# Patient Record
Sex: Female | Born: 1960 | Race: Black or African American | Hispanic: No | Smoking: Current every day smoker
Health system: Southern US, Community
[De-identification: ages and names within clinical notes are randomized; demographics above are authoritative.]

## PROBLEM LIST (undated history)

## (undated) DIAGNOSIS — F419 Anxiety disorder, unspecified: Secondary | ICD-10-CM

## (undated) DIAGNOSIS — I219 Acute myocardial infarction, unspecified: Secondary | ICD-10-CM

## (undated) DIAGNOSIS — I2510 Atherosclerotic heart disease of native coronary artery without angina pectoris: Secondary | ICD-10-CM

## (undated) DIAGNOSIS — IMO0002 Reserved for concepts with insufficient information to code with codable children: Secondary | ICD-10-CM

## (undated) DIAGNOSIS — E119 Type 2 diabetes mellitus without complications: Secondary | ICD-10-CM

## (undated) DIAGNOSIS — M329 Systemic lupus erythematosus, unspecified: Secondary | ICD-10-CM

## (undated) DIAGNOSIS — E079 Disorder of thyroid, unspecified: Secondary | ICD-10-CM

## (undated) DIAGNOSIS — I10 Essential (primary) hypertension: Secondary | ICD-10-CM

## (undated) DIAGNOSIS — F32A Depression, unspecified: Secondary | ICD-10-CM

## (undated) DIAGNOSIS — K219 Gastro-esophageal reflux disease without esophagitis: Secondary | ICD-10-CM

## (undated) DIAGNOSIS — Z862 Personal history of diseases of the blood and blood-forming organs and certain disorders involving the immune mechanism: Secondary | ICD-10-CM

## (undated) DIAGNOSIS — M5430 Sciatica, unspecified side: Secondary | ICD-10-CM

## (undated) DIAGNOSIS — F329 Major depressive disorder, single episode, unspecified: Secondary | ICD-10-CM

## (undated) DIAGNOSIS — G5600 Carpal tunnel syndrome, unspecified upper limb: Secondary | ICD-10-CM

## (undated) DIAGNOSIS — M722 Plantar fascial fibromatosis: Secondary | ICD-10-CM

## (undated) HISTORY — DX: Plantar fascial fibromatosis: M72.2

## (undated) HISTORY — PX: TUBAL LIGATION: SHX77

## (undated) HISTORY — DX: Personal history of diseases of the blood and blood-forming organs and certain disorders involving the immune mechanism: Z86.2

## (undated) HISTORY — DX: Carpal tunnel syndrome, unspecified upper limb: G56.00

## (undated) HISTORY — DX: Sciatica, unspecified side: M54.30

## (undated) HISTORY — DX: Atherosclerotic heart disease of native coronary artery without angina pectoris: I25.10

---

## 2006-12-26 ENCOUNTER — Ambulatory Visit (HOSPITAL_COMMUNITY): Admission: RE | Admit: 2006-12-26 | Discharge: 2006-12-26 | Payer: Self-pay | Admitting: Obstetrics & Gynecology

## 2006-12-26 ENCOUNTER — Inpatient Hospital Stay: Admission: AD | Admit: 2006-12-26 | Discharge: 2006-12-26 | Payer: Self-pay | Admitting: Obstetrics & Gynecology

## 2009-02-17 HISTORY — PX: CORONARY ANGIOPLASTY WITH STENT PLACEMENT: SHX49

## 2011-10-19 ENCOUNTER — Emergency Department (INDEPENDENT_AMBULATORY_CARE_PROVIDER_SITE_OTHER): Payer: Self-pay

## 2011-10-19 ENCOUNTER — Encounter: Payer: Self-pay | Admitting: *Deleted

## 2011-10-19 ENCOUNTER — Emergency Department (HOSPITAL_BASED_OUTPATIENT_CLINIC_OR_DEPARTMENT_OTHER)
Admission: EM | Admit: 2011-10-19 | Discharge: 2011-10-19 | Disposition: A | Discharge status: Home / Self Care | Payer: Self-pay | Attending: Emergency Medicine | Admitting: Emergency Medicine

## 2011-10-19 ENCOUNTER — Other Ambulatory Visit: Payer: Self-pay

## 2011-10-19 DIAGNOSIS — F341 Dysthymic disorder: Secondary | ICD-10-CM | POA: Insufficient documentation

## 2011-10-19 DIAGNOSIS — M79609 Pain in unspecified limb: Secondary | ICD-10-CM

## 2011-10-19 DIAGNOSIS — I2510 Atherosclerotic heart disease of native coronary artery without angina pectoris: Secondary | ICD-10-CM | POA: Insufficient documentation

## 2011-10-19 DIAGNOSIS — M5412 Radiculopathy, cervical region: Secondary | ICD-10-CM | POA: Insufficient documentation

## 2011-10-19 DIAGNOSIS — Z79899 Other long term (current) drug therapy: Secondary | ICD-10-CM | POA: Insufficient documentation

## 2011-10-19 DIAGNOSIS — R079 Chest pain, unspecified: Secondary | ICD-10-CM

## 2011-10-19 DIAGNOSIS — I10 Essential (primary) hypertension: Secondary | ICD-10-CM | POA: Insufficient documentation

## 2011-10-19 DIAGNOSIS — R209 Unspecified disturbances of skin sensation: Secondary | ICD-10-CM | POA: Insufficient documentation

## 2011-10-19 DIAGNOSIS — M542 Cervicalgia: Secondary | ICD-10-CM

## 2011-10-19 DIAGNOSIS — K219 Gastro-esophageal reflux disease without esophagitis: Secondary | ICD-10-CM | POA: Insufficient documentation

## 2011-10-19 DIAGNOSIS — E039 Hypothyroidism, unspecified: Secondary | ICD-10-CM | POA: Insufficient documentation

## 2011-10-19 HISTORY — DX: Essential (primary) hypertension: I10

## 2011-10-19 HISTORY — DX: Atherosclerotic heart disease of native coronary artery without angina pectoris: I25.10

## 2011-10-19 HISTORY — DX: Anxiety disorder, unspecified: F41.9

## 2011-10-19 HISTORY — DX: Disorder of thyroid, unspecified: E07.9

## 2011-10-19 HISTORY — DX: Major depressive disorder, single episode, unspecified: F32.9

## 2011-10-19 HISTORY — DX: Gastro-esophageal reflux disease without esophagitis: K21.9

## 2011-10-19 HISTORY — DX: Depression, unspecified: F32.A

## 2011-10-19 MED ORDER — IBUPROFEN 800 MG PO TABS: 800.0000 mg | ORAL_TABLET | Freq: Three times a day (TID) | ORAL | Status: AC

## 2011-10-19 MED ORDER — DIAZEPAM 5 MG PO TABS
5.0000 mg | ORAL_TABLET | Freq: Once | ORAL | Status: AC
  Administered 2011-10-19: 5 mg via ORAL
  Filled 2011-10-19: qty 1

## 2011-10-19 MED ORDER — METHYLPREDNISOLONE 4 MG PO KIT: PACK | ORAL | Status: AC

## 2011-10-19 MED ORDER — OXYCODONE-ACETAMINOPHEN 5-325 MG PO TABS
1.0000 | ORAL_TABLET | Freq: Once | ORAL | Status: AC
  Administered 2011-10-19: 1 via ORAL
  Filled 2011-10-19: qty 1

## 2011-10-19 MED ORDER — PREDNISONE 50 MG PO TABS
60.0000 mg | ORAL_TABLET | Freq: Once | ORAL | Status: DC
  Filled 2011-10-19: qty 1

## 2011-10-19 MED ORDER — OXYCODONE-ACETAMINOPHEN 5-325 MG PO TABS: 2.0000 | ORAL_TABLET | ORAL | Status: AC | PRN

## 2011-10-19 NOTE — ED Notes (Signed)
Pt seen by PCP today for shoulder pain- states she was sent to [facility] by him for eval of "pinched nerve in neck" but came here instead

## 2011-10-19 NOTE — ED Notes (Signed)
Pt returned from radiology, NAD noted.

## 2011-10-19 NOTE — ED Notes (Signed)
Pt states that she has had an adverse rxn to prednisone in the past with chest pain and heart fluttering. Pt refuses prednisone at this time.

## 2011-10-19 NOTE — ED Provider Notes (Signed)
History     CSN: [id number] Arrival date & time: 10/19/2011 10:00 PM   First MD Initiated Contact with Patient 10/19/11 2204      Chief Complaint  Patient presents with  . Shoulder Pain    (Consider location/radiation/quality/duration/timing/severity/associated sxs/prior treatment) HPI Comments: Patient has right arm, shoulder and neck pain since October 1. She has been seen by her PCP, urgent care and [place] [facility]. She had x-rays, MRI and lab work and was told everything was normal. Her doctor today told her to [facility] ER for a steroid injection but the wait was too long so she came here. She complains of pain in the right side of her neck that radiates to her shoulder and entire arm. This is associated with tingling and numbness. She denies any fever, vomiting, bowel or bladder incontinence. Denies any trauma but thinks she may have slept funny on her arm several weeks ago. Denies any chest pain, shortness of breath. She does not have any grip strength weakness has not been dropping objects.  The history is provided by the patient.    Past Medical History  Diagnosis Date  . Coronary artery disease   . Hypertension   . Thyroid disease   . Depression   . Acid reflux   . Anxiety     Past Surgical History  Procedure Date  . Coronary angioplasty with stent placement   . Tubal ligation     No family history on file.  History  Substance Use Topics  . Smoking status: Former Games developer  . Smokeless tobacco: Not on file  . Alcohol Use: 0.6 oz/week    1 Glasses of wine per week    OB History    Grav Para Term Preterm Abortions TAB SAB Ect Mult Living                  Review of Systems  Constitutional: Negative for fever, activity change and appetite change.  HENT: Negative for congestion and rhinorrhea.   Respiratory: Negative for cough, chest tightness and shortness of breath.   Cardiovascular: Negative for chest pain.  Gastrointestinal: Negative for nausea, vomiting  and abdominal pain.  Genitourinary: Negative for dysuria and hematuria.  Musculoskeletal: Negative for back pain.  Neurological: Positive for numbness. Negative for headaches.    Allergies  Prednisone  Home Medications   Current Outpatient Rx  Name Route Sig Dispense Refill  . ASPIRIN 81 MG PO TABS Oral Take 81 mg by mouth daily.      . ATORVASTATIN CALCIUM 80 MG PO TABS Oral Take 80 mg by mouth daily.      Marland Kitchen ESCITALOPRAM OXALATE 20 MG PO TABS Oral Take 20 mg by mouth daily.      Marland Kitchen EZETIMIBE 10 MG PO TABS Oral Take 10 mg by mouth daily.      Marland Kitchen METOPROLOL TARTRATE 25 MG PO TABS Oral Take 12.5 mg by mouth 2 (two) times daily.      Marland Kitchen NAPROXEN 500 MG PO TABS Oral Take 500 mg by mouth every 12 (twelve) hours.      Marland Kitchen RANITIDINE HCL 150 MG PO TABS Oral Take 150 mg by mouth 2 (two) times daily.      . IBUPROFEN 800 MG PO TABS Oral Take 1 tablet (800 mg total) by mouth 3 (three) times daily. 21 tablet 0  . METHYLPREDNISOLONE 4 MG PO KIT  follow package directions 21 tablet 0  . OXYCODONE-ACETAMINOPHEN 5-325 MG PO TABS Oral Take 2 tablets  by mouth every 4 (four) hours as needed for pain. 15 tablet 0    BP 131/107  Pulse 63  Temp(Src) 98.1 F (36.7 C) (Oral)  Resp 20  Ht 4\' 10"  (1.473 m)  Wt 191 lb (86.637 kg)  BMI 39.92 kg/m2  SpO2 100%  LMP 07/23/2011  Physical Exam  Constitutional: She is oriented to person, place, and time. She appears well-developed and well-nourished. No distress.  HENT:  Head: Normocephalic and atraumatic.  Mouth/Throat: Oropharynx is clear and moist. No oropharyngeal exudate.  Eyes: Conjunctivae are normal. Pupils are equal, round, and reactive to light.  Neck: Normal range of motion.       R parapinal tenderness   Cardiovascular: Normal rate, regular rhythm and normal heart sounds.   Pulmonary/Chest: Effort normal and breath sounds normal. No respiratory distress.  Abdominal: Soft. There is no tenderness. There is no rebound and no guarding.    Musculoskeletal: Normal range of motion. She exhibits tenderness.       R upper trapezius TTP with spasm. FROM of shoulder and able to range above head.   Neurological: She is alert and oriented to person, place, and time. No cranial nerve deficit.       Equal grip strengths, equal push and pull of upper extremiteis  Skin: Skin is warm.    ED Course  Procedures (including critical care time)  Labs Reviewed - No data to display Dg Chest 2 View  10/19/2011  *RADIOLOGY REPORT*  Clinical Data: Right neck pain, radiating to the right side of the chest and right arm.  CHEST - 2 VIEW  Comparison: None.  Findings: The lungs are well-aerated and clear.  There is no evidence of focal opacification, pleural effusion or pneumothorax.  The heart is normal in size; the mediastinal contour is within normal limits.  No acute osseous abnormalities are seen.  The right shoulder is unremarkable in appearance.  IMPRESSION: No acute cardiopulmonary process seen; no displaced rib fractures identified.  The right shoulder is unremarkable in appearance.  Original Report Authenticated By: Tonia Ghent, M.D.   Dg Cervical Spine Complete  10/19/2011  *RADIOLOGY REPORT*  Clinical Data: Right-sided neck pain, radiating to the right arm and right side of the chest.  CERVICAL SPINE - COMPLETE 4+ VIEW  Comparison: None.  Findings: There is no evidence of fracture or subluxation. Vertebral bodies demonstrate normal height and alignment. Intervertebral disc spaces are preserved.  Anterior and posterior disc osteophyte complexes are noted at multiple levels along the cervical spine.  Prevertebral soft tissues are within normal limits.  The provided odontoid view demonstrates no significant abnormality.  The visualized lung apices are clear.  IMPRESSION:  1.  No evidence of fracture or subluxation along the cervical spine. 2.  Mild degenerative change noted along the cervical spine.  Original Report Authenticated By: Tonia Ghent,  M.D.     1. Cervical radiculopathy       MDM  R neck, shoulder, arm pain, seemingly consistent with cervical radiculopathy.  No weakness or other neurological red flags   Date: 10/19/2011  Rate: 50  Rhythm: sinus bradycardia  QRS Axis: normal  Intervals: normal  ST/T Wave abnormalities: normal  Conduction Disutrbances:none  Narrative Interpretation:   Old EKG Reviewed: none available  Records obtained from [facility] show that patient was admitted on October 21 with right shoulder pain that she been having for the past 2 weeks. At that time should difficulty moving her shoulder MRI was obtained that showed no  evidence of rotator cuff tear with degenerative changes of the proximal humerus and a.c. joints MRI of the C-spine was not performed.  Exam today seems more consistent with cervical radiculopathy that a primary shoulder problem. I informed the patient that she needs to follow up with [facility] for a MRI of her cervical spine will treat her symptoms with anti-inflammatories and pain medication. She does not have any neurological deficits today was instructed to return if she develops weakness in the arm, worsening pain, or any other concerning symptoms.     Glynn Octave, MD 10/20/11 [phone]

## 2011-11-25 ENCOUNTER — Emergency Department (HOSPITAL_BASED_OUTPATIENT_CLINIC_OR_DEPARTMENT_OTHER)
Admission: EM | Admit: 2011-11-25 | Discharge: 2011-11-25 | Disposition: A | Discharge status: Other Acute Inpatient Hospital | Payer: Self-pay | Attending: Emergency Medicine | Admitting: Emergency Medicine

## 2011-11-25 ENCOUNTER — Encounter (HOSPITAL_BASED_OUTPATIENT_CLINIC_OR_DEPARTMENT_OTHER): Payer: Self-pay | Admitting: *Deleted

## 2011-11-25 ENCOUNTER — Emergency Department (INDEPENDENT_AMBULATORY_CARE_PROVIDER_SITE_OTHER): Payer: Self-pay

## 2011-11-25 DIAGNOSIS — F341 Dysthymic disorder: Secondary | ICD-10-CM | POA: Insufficient documentation

## 2011-11-25 DIAGNOSIS — I2510 Atherosclerotic heart disease of native coronary artery without angina pectoris: Secondary | ICD-10-CM | POA: Insufficient documentation

## 2011-11-25 DIAGNOSIS — I10 Essential (primary) hypertension: Secondary | ICD-10-CM

## 2011-11-25 DIAGNOSIS — R6884 Jaw pain: Secondary | ICD-10-CM | POA: Insufficient documentation

## 2011-11-25 DIAGNOSIS — E079 Disorder of thyroid, unspecified: Secondary | ICD-10-CM | POA: Insufficient documentation

## 2011-11-25 DIAGNOSIS — R079 Chest pain, unspecified: Secondary | ICD-10-CM | POA: Insufficient documentation

## 2011-11-25 DIAGNOSIS — R110 Nausea: Secondary | ICD-10-CM

## 2011-11-25 LAB — BASIC METABOLIC PANEL
CO2: 27 mEq/L (ref 19–32)
Calcium: 9.1 mg/dL (ref 8.4–10.5)
Chloride: 105 mEq/L (ref 96–112)

## 2011-11-25 LAB — PROTIME-INR
INR: 0.96 (ref 0.00–1.49)
Prothrombin Time: 13 seconds (ref 11.6–15.2)

## 2011-11-25 LAB — CARDIAC PANEL(CRET KIN+CKTOT+MB+TROPI): CK, MB: 1.9 ng/mL (ref 0.3–4.0)

## 2011-11-25 LAB — CBC
Hemoglobin: 11.4 g/dL — ABNORMAL LOW (ref 12.0–15.0)
MCHC: 32.3 g/dL (ref 30.0–36.0)
MCV: 80.8 fL (ref 78.0–100.0)
Platelets: 207 10*3/uL (ref 150–400)

## 2011-11-25 MED ORDER — MORPHINE SULFATE 4 MG/ML IJ SOLN
4.0000 mg | Freq: Once | INTRAMUSCULAR | Status: AC
  Administered 2011-11-25: 4 mg via INTRAVENOUS
  Filled 2011-11-25: qty 1

## 2011-11-25 MED ORDER — ONDANSETRON HCL 4 MG/2ML IJ SOLN
4.0000 mg | Freq: Once | INTRAMUSCULAR | Status: AC
  Administered 2011-11-25: 4 mg via INTRAVENOUS
  Filled 2011-11-25: qty 2

## 2011-11-25 MED ORDER — NITROGLYCERIN 0.4 MG SL SUBL
0.4000 mg | SUBLINGUAL_TABLET | SUBLINGUAL | Status: DC | PRN
  Filled 2011-11-25: qty 25

## 2011-11-25 MED ORDER — ASPIRIN 81 MG PO CHEW
324.0000 mg | CHEWABLE_TABLET | Freq: Once | ORAL | Status: AC
  Administered 2011-11-25: 243 mg via ORAL
  Filled 2011-11-25: qty 1

## 2011-11-25 MED ORDER — ACETAMINOPHEN 325 MG PO TABS
650.0000 mg | ORAL_TABLET | Freq: Once | ORAL | Status: AC
  Administered 2011-11-25: 650 mg via ORAL
  Filled 2011-11-25: qty 2

## 2011-11-25 NOTE — ED Notes (Signed)
Report to Alens, RN at [facility] (room 700)

## 2011-11-25 NOTE — ED Notes (Signed)
Pt states she has been nauseated for a couple days. Weak. Pain in jaw and left shoulder and arm. Took Aspirin last pm, but s/s are worse today.

## 2011-11-25 NOTE — ED Provider Notes (Signed)
History  Scribed for Dione Booze, MD, the patient was seen in room MHT13/MHT13. This chart was scribed by Candelaria Stagers. The patient's care started at 16:00   CSN: [id number] Arrival date & time: 11/25/2011  3:49 PM   First MD Initiated Contact with Patient 11/25/11 1552      Chief Complaint  Patient presents with  . Jaw Pain     The history is provided by the patient.  Robin Hebert is a 50 y.o. female who presents to the Emergency Department complaining of dull achey jaw and shoulder pain which began about 24 hours ago.  She has also been experiencing nausea for the past three days.  She describes the pain as a 6/10 now and a 10/10 at worse.  She has taken aspirin with no relief and nothing else seems to make the pain better or worse.  She is experiencing no fever, chill, but states she has been diaphoretic.  Patient has a h/o MI and states this pain feels similar, but not as bad.  She occasionally drinks and quit smoking about three years ago.  Her cardiologist is Dr. Beverely Pace with [facility].     Past Medical History  Diagnosis Date  . Coronary artery disease   . Hypertension   . Thyroid disease   . Depression   . Acid reflux   . Anxiety     Past Surgical History  Procedure Date  . Coronary angioplasty with stent placement   . Tubal ligation     History reviewed. No pertinent family history.  History  Substance Use Topics  . Smoking status: Former Games developer  . Smokeless tobacco: Not on file  . Alcohol Use: 0.6 oz/week    1 Glasses of wine per week     Review of Systems  Constitutional: Positive for diaphoresis. Negative for fever and chills.  Respiratory: Negative for cough and shortness of breath.   10 Systems reviewed and are negative for acute change except as noted in the HPI.   Allergies  Prednisone  Home Medications   Current Outpatient Rx  Name Route Sig Dispense Refill  . ASPIRIN 81 MG PO TABS Oral Take 81 mg by mouth daily.      .  ATORVASTATIN CALCIUM 80 MG PO TABS Oral Take 80 mg by mouth daily.      Marland Kitchen ESCITALOPRAM OXALATE 20 MG PO TABS Oral Take 20 mg by mouth daily.      Marland Kitchen EZETIMIBE 10 MG PO TABS Oral Take 10 mg by mouth daily.      Marland Kitchen METOPROLOL TARTRATE 25 MG PO TABS Oral Take 25 mg by mouth daily.     Marland Kitchen NAPROXEN 500 MG PO TABS Oral Take 500 mg by mouth 2 (two) times daily as needed. For pain     . RANITIDINE HCL 150 MG PO TABS Oral Take 150 mg by mouth 2 (two) times daily.        BP 135/79  Pulse 52  Temp(Src) 98.3 F (36.8 C) (Oral)  Resp 20  Ht 4\' 10"  (1.473 m)  Wt 191 lb (86.637 kg)  BMI 39.92 kg/m2  SpO2 98%  LMP 05/26/2011  Physical Exam  Constitutional: She is oriented to person, place, and time. She appears well-developed and well-nourished. No distress.  HENT:  Head: Normocephalic and atraumatic.  Mouth/Throat: Oropharynx is clear and moist. No oropharyngeal exudate.  Eyes: EOM are normal. Right eye exhibits no discharge. Left eye exhibits no discharge.  Neck: Normal range of motion.  Neck supple.  Cardiovascular: Normal rate, regular rhythm and normal heart sounds.   Pulmonary/Chest: Effort normal and breath sounds normal. She exhibits tenderness (left peristernal chest tenderness).  Abdominal: Soft. She exhibits no mass. There is no tenderness.  Lymphadenopathy:    She has no cervical adenopathy.  Neurological: She is alert and oriented to person, place, and time.  Skin: Skin is warm and dry.  Psychiatric: She has a normal mood and affect. Her behavior is normal.  Note: above should read left parasternal tenderness.  ED Course  Procedures   DIAGNOSTIC STUDIES: Oxygen Saturation is 98% on room air, normal by my interpretation.    COORDINATION OF CARE:  16:04 Ordered: DG CHEST PORT 1 VIEW  Cardiac monitoring ; Pulse oximetry, continuous ; ED EKG (<24mins upon arrival to the ED) ; Saline lock IV ; Draw & Hold Blood RAINBOW ; CBC ; Basic metabolic panel ; BNP (if dyspnea) ; Weigh patient  ; Oxygen therapy ; nitroGLYCERIN (NITROSTAT) SL tablet 0.4 mg ; Cardiac panel(cret kin+cktot+mb+tropi) ; Protime-INR (if Coumadin Pt) ; Chest Portable 1 View ; aspirin chewable tablet 324 mg ; APTT ; acetaminophen (TYLENOL) tablet 650 mg ; ondansetron (ZOFRAN) injection 4 mg    Labs Reviewed  CBC - Abnormal; Notable for the following:    Hemoglobin 11.4 (*)    HCT 35.3 (*)    RDW 15.8 (*)    All other components within normal limits  BASIC METABOLIC PANEL - Abnormal; Notable for the following:    Glucose, Bld 109 (*)    All other components within normal limits  APTT - Abnormal; Notable for the following:    aPTT 22 (*)    All other components within normal limits  PRO B NATRIURETIC PEPTIDE  CARDIAC PANEL(CRET KIN+CKTOT+MB+TROPI)  PROTIME-INR   Chest Portable 1 View  11/25/2011  *RADIOLOGY REPORT*  Clinical Data: Chest pain and nausea, hypertension  PORTABLE CHEST - 1 VIEW  Comparison: October 19, 2011  Findings: The cardiac silhouette, mediastinum, pulmonary vasculature are within normal limits.  Both lungs are clear. There is no acute bony abnormality.  IMPRESSION: There is no evidence of acute cardiac or pulmonary process.  Original Report Authenticated By: Brandon Melnick, M.D.     No diagnosis found.  ECG shows normal sinus rhythm with a rate of 59, no ectopy. Normal axis. Normal P wave. Normal QRS. Normal intervals. Normal ST and T waves. Impression: normal ECG. When compared with ECG of 10/19/2011, no significant change is noted.  She refused nitroglycerin. For evaluation at 6:05 PM and: She states she is feeling better and the pain is down to 3/10. Workup is negative for acute coronary injury, but in light of how similar symptoms to her heart attacks, or arrange transfer to [facility]. She states her last coronary stent was about one year ago.  Case is discussed with Dr. Anson Fret, hospitalist at [facility], who accepts the  patient in transfer.  MDM  Jaw and shoulder pain which need to be considered an angina equivalent. I anticipate having her admitted once initial workup and stabilization of been completed.   I personally performed the services described in this documentation, which was scribed in my presence. The recorded information has been reviewed and considered.       Dione Booze, MD 11/25/11 [phone]

## 2013-01-09 IMAGING — CR DG CERVICAL SPINE COMPLETE 4+V
6 series · 6 of 6 positions shown · non-contrast
Comparison: None.

CLINICAL DATA: Right-sided neck pain, radiating to the right arm
and right side of the chest.

CERVICAL SPINE - COMPLETE 4+ VIEW

[w c-spine a.p.]
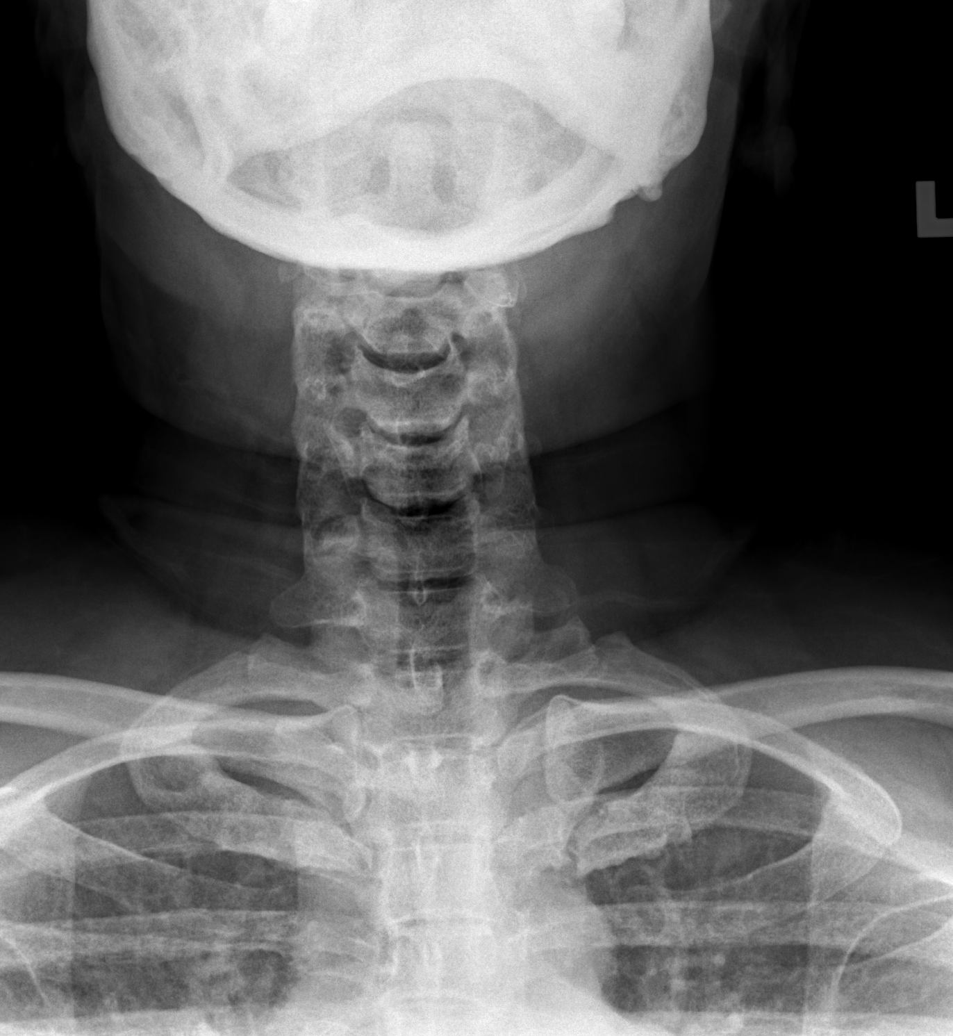

[w c-spine oblique]
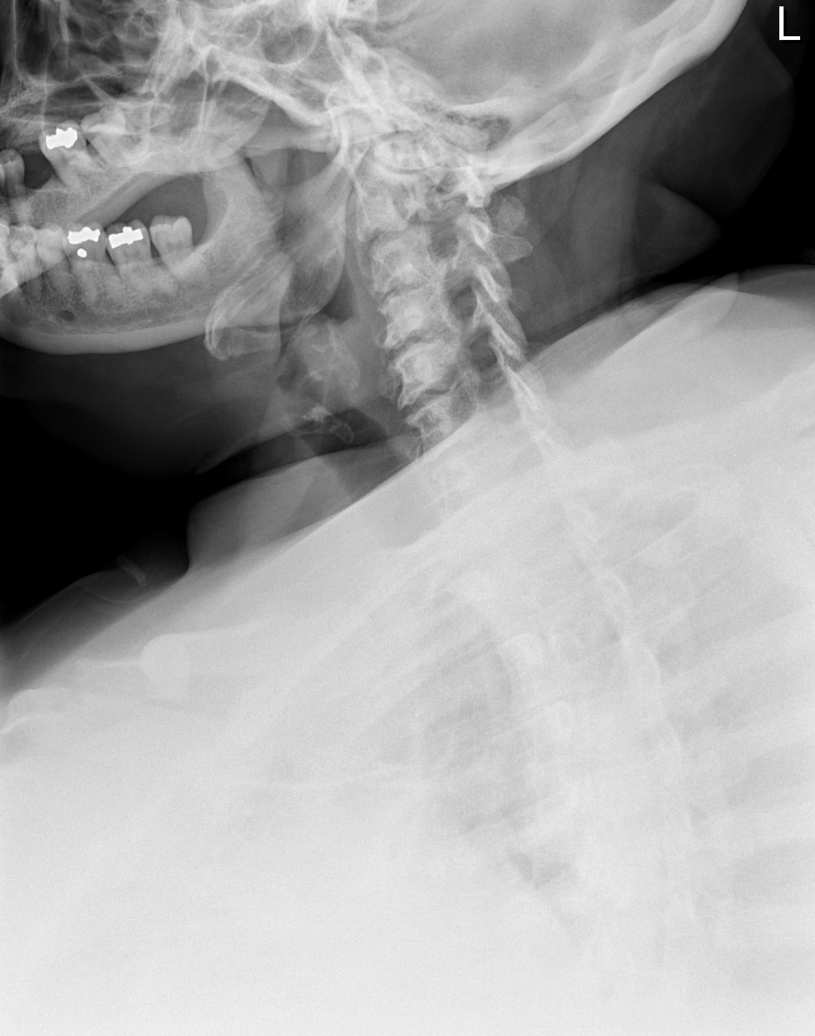

[w c-spine oblique *]
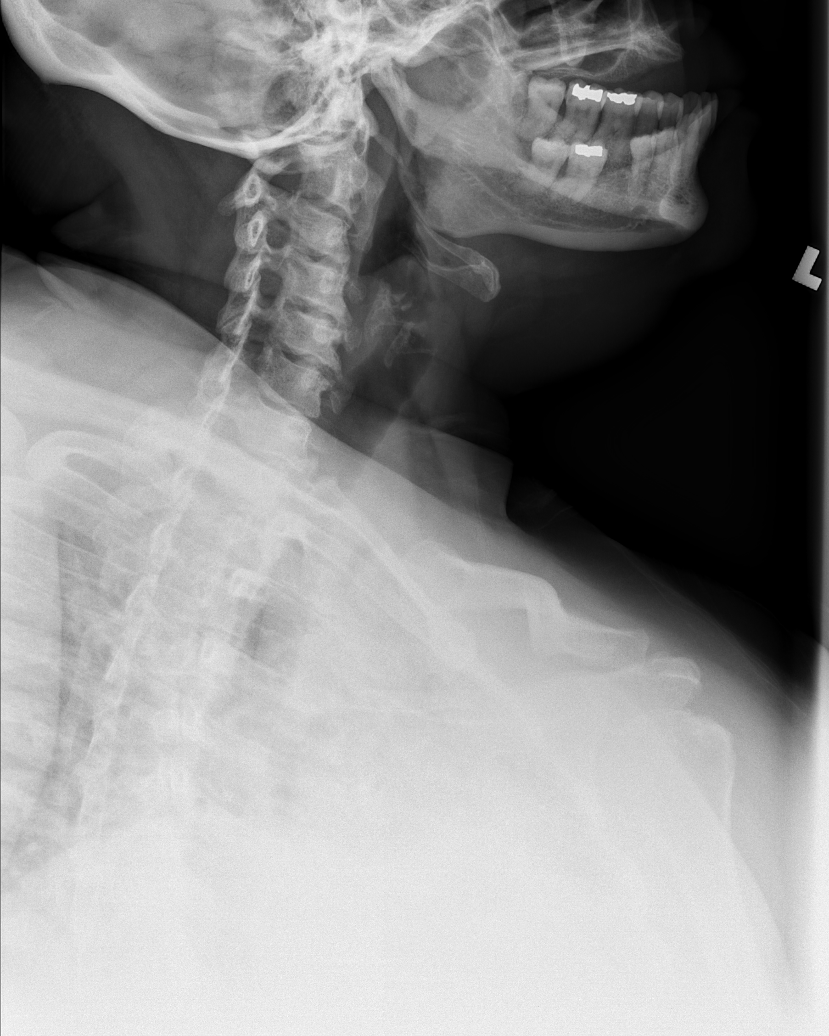

[w c-spine lat]
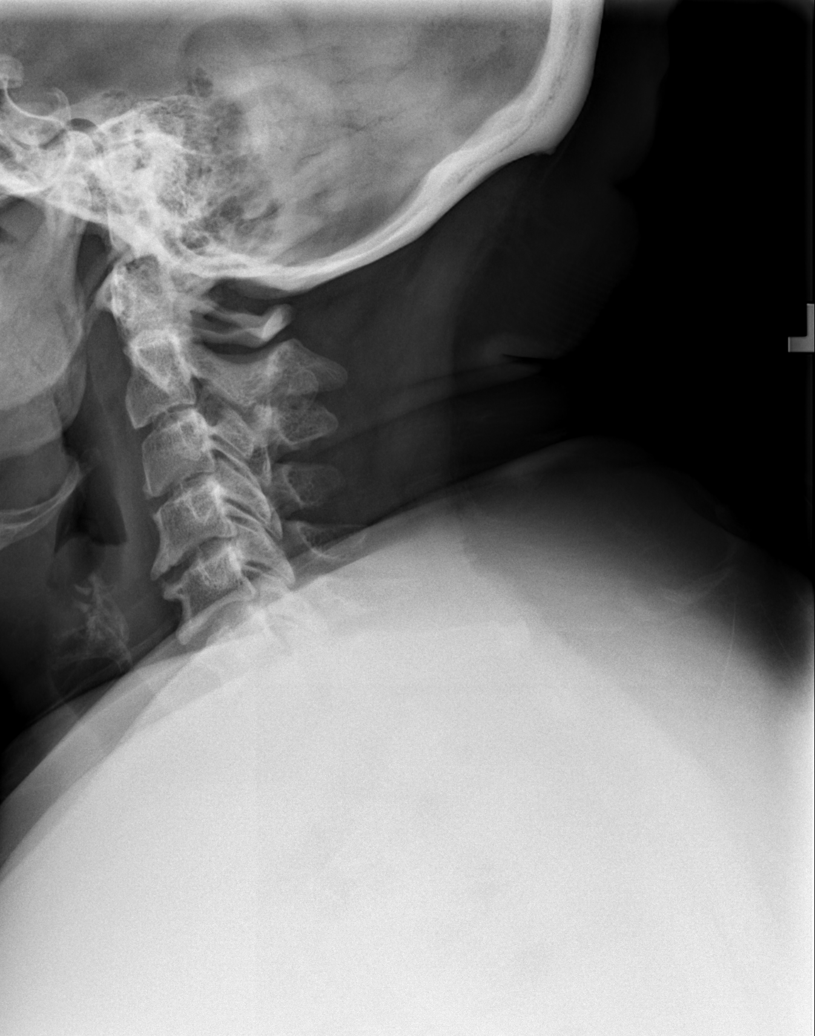

[w swimmers view *]
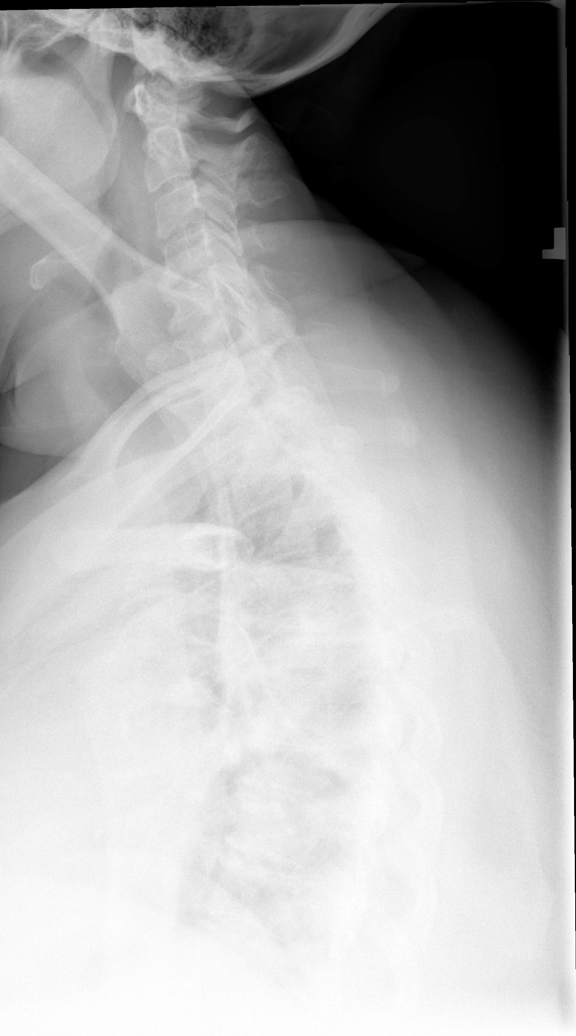

[w c-spine odontoid]
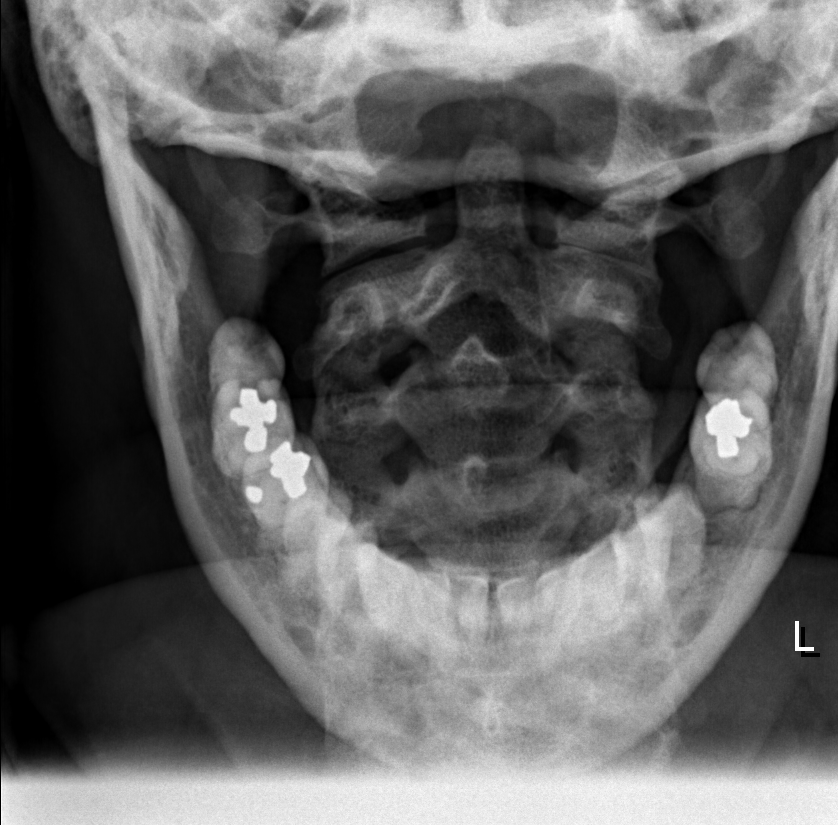

[6 of 6 positions shown; findings below may reference images not displayed]

FINDINGS: There is no evidence of fracture or subluxation.
Vertebral bodies demonstrate normal height and alignment.
Intervertebral disc spaces are preserved.  Anterior and posterior
disc osteophyte complexes are noted at multiple levels along the
cervical spine.  Prevertebral soft tissues are within normal
limits.  The provided odontoid view demonstrates no significant
abnormality.

The visualized lung apices are clear.
IMPRESSION: 1.  No evidence of fracture or subluxation along the cervical
spine.
2.  Mild degenerative change noted along the cervical spine.

## 2013-01-09 IMAGING — CR DG CHEST 2V
2 series · 2 of 2 positions shown · non-contrast
Comparison: None.

CLINICAL DATA: Right neck pain, radiating to the right side of the
chest and right arm.

CHEST - 2 VIEW

[w chest pa]
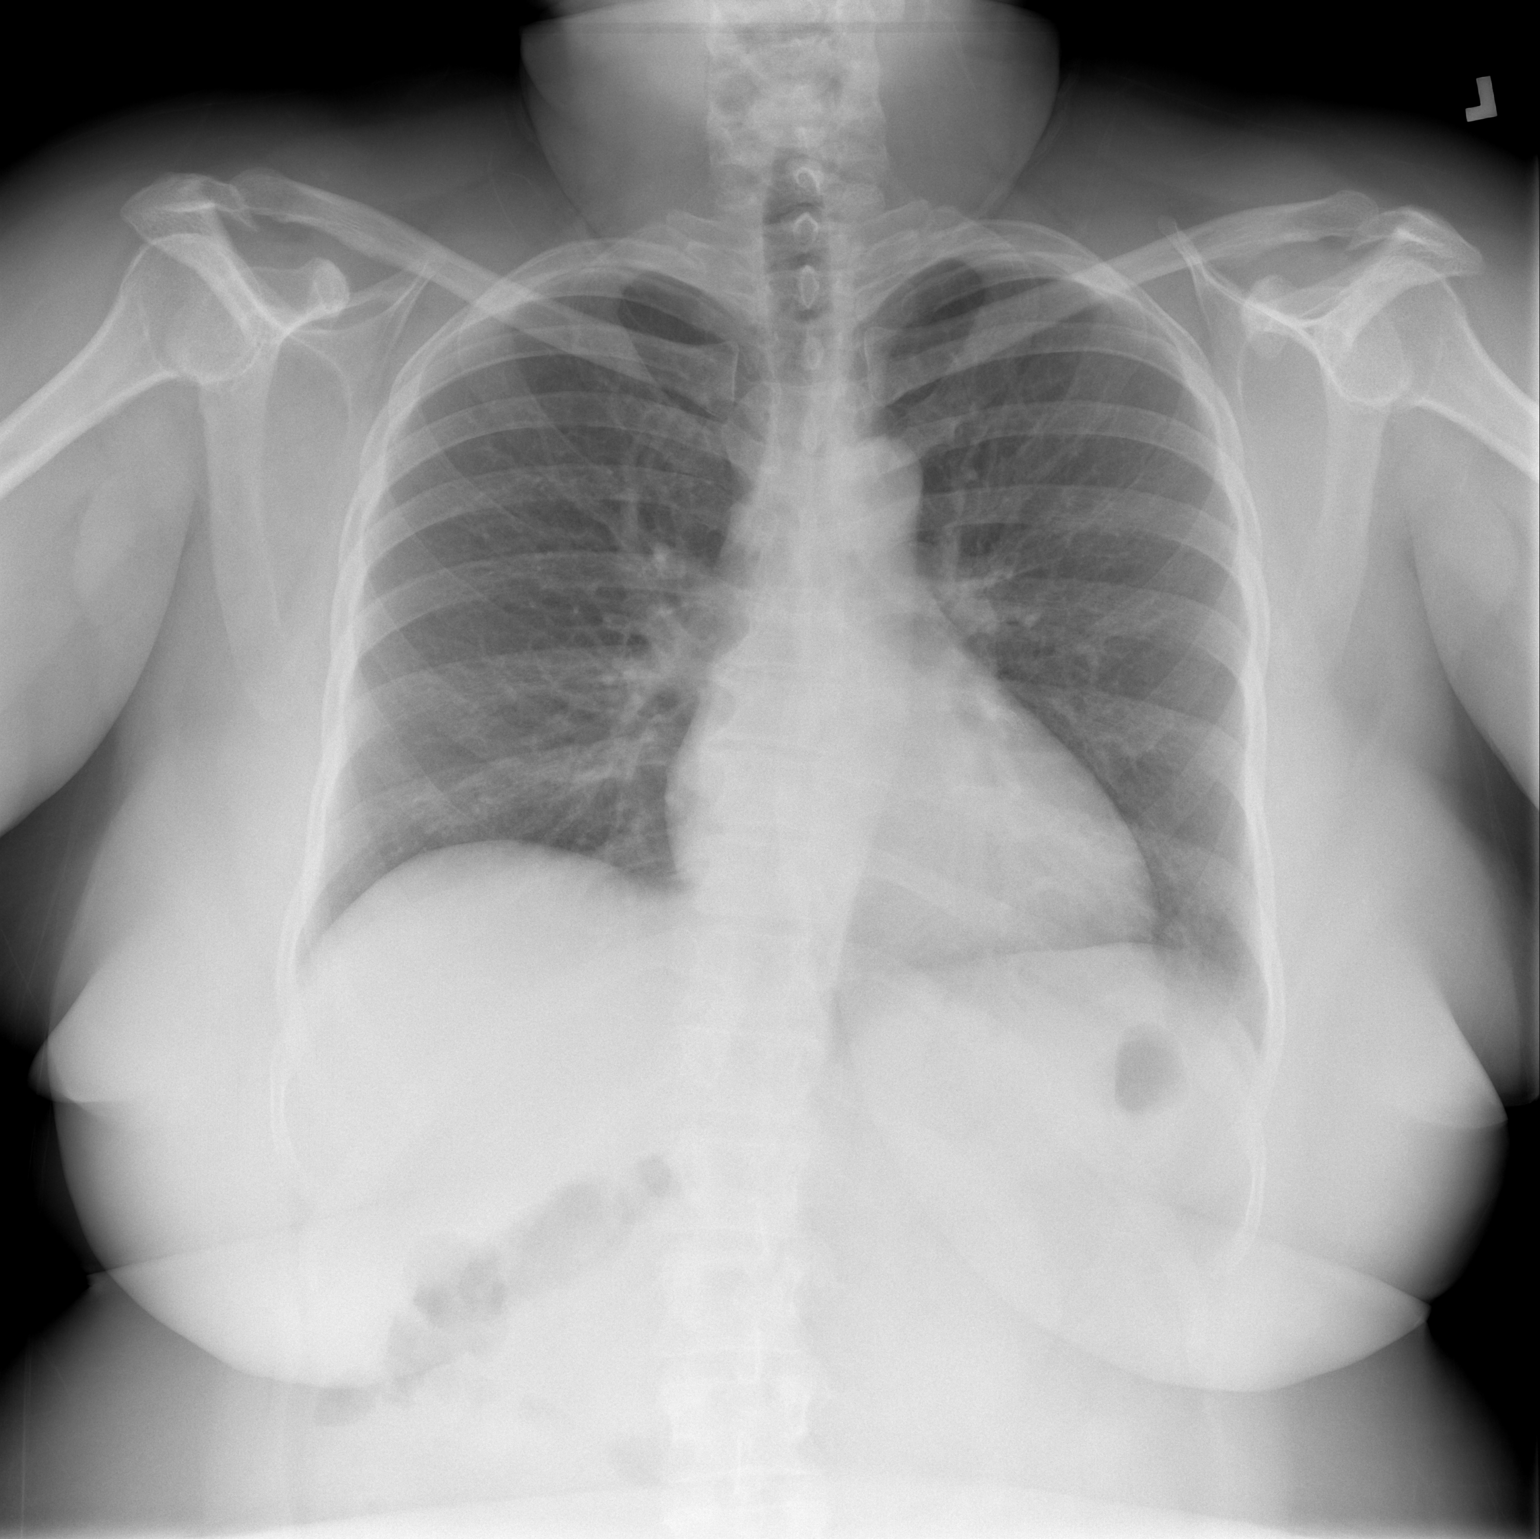

[w chest lat]
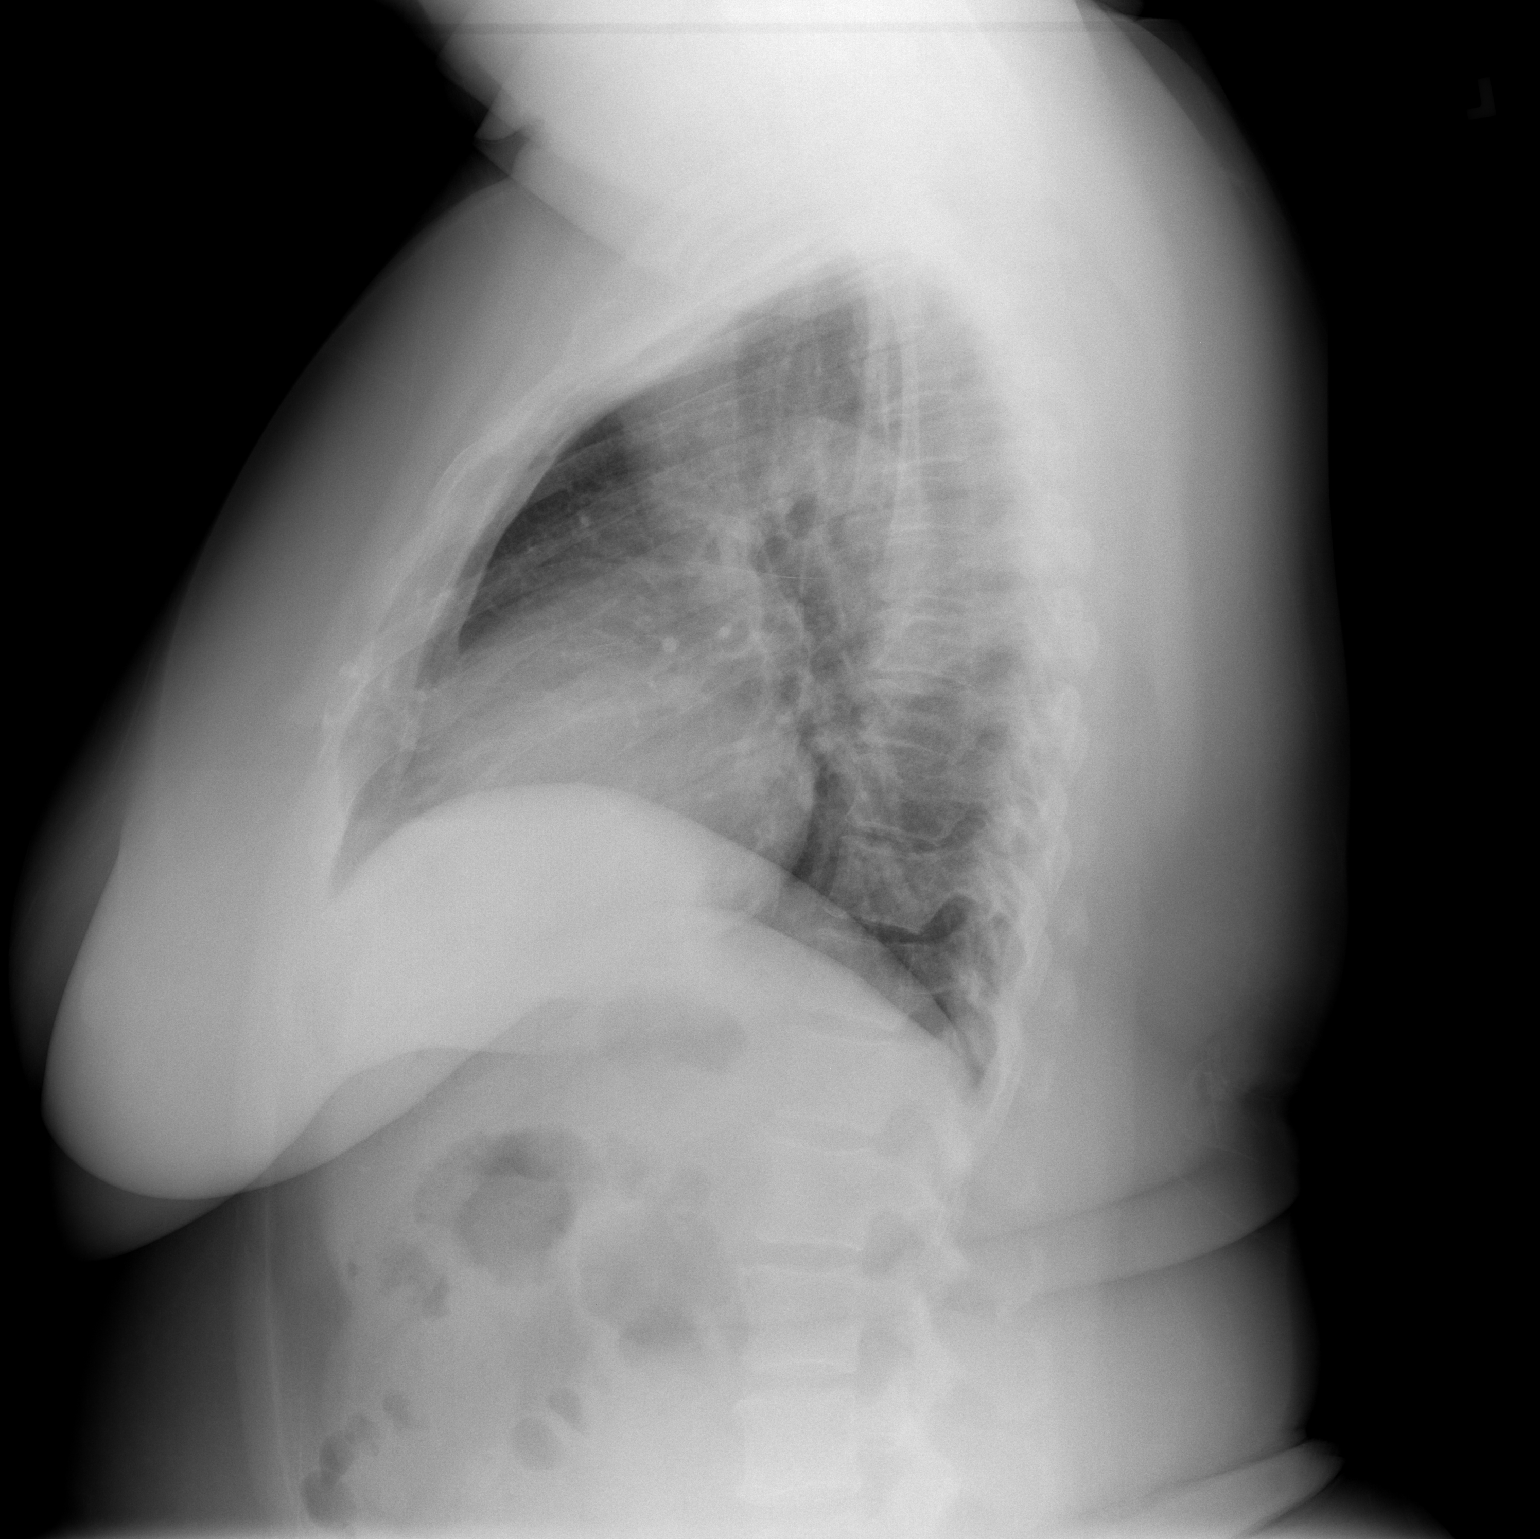

[2 of 2 positions shown; findings below may reference images not displayed]

FINDINGS: The lungs are well-aerated and clear.  There is no
evidence of focal opacification, pleural effusion or pneumothorax.

The heart is normal in size; the mediastinal contour is within
normal limits.  No acute osseous abnormalities are seen.  The right
shoulder is unremarkable in appearance.
IMPRESSION: No acute cardiopulmonary process seen; no displaced rib fractures
identified.  The right shoulder is unremarkable in appearance.

## 2013-04-24 ENCOUNTER — Encounter: Payer: Self-pay | Admitting: Cardiovascular Disease

## 2013-04-24 ENCOUNTER — Encounter: Payer: Self-pay | Admitting: *Deleted

## 2013-04-24 DIAGNOSIS — I2510 Atherosclerotic heart disease of native coronary artery without angina pectoris: Secondary | ICD-10-CM | POA: Insufficient documentation

## 2013-04-24 DIAGNOSIS — K219 Gastro-esophageal reflux disease without esophagitis: Secondary | ICD-10-CM | POA: Insufficient documentation

## 2013-04-24 DIAGNOSIS — I10 Essential (primary) hypertension: Secondary | ICD-10-CM | POA: Insufficient documentation

## 2013-04-24 DIAGNOSIS — F329 Major depressive disorder, single episode, unspecified: Secondary | ICD-10-CM | POA: Insufficient documentation

## 2013-04-24 DIAGNOSIS — F419 Anxiety disorder, unspecified: Secondary | ICD-10-CM | POA: Insufficient documentation

## 2013-04-24 DIAGNOSIS — E079 Disorder of thyroid, unspecified: Secondary | ICD-10-CM | POA: Insufficient documentation

## 2013-04-25 ENCOUNTER — Ambulatory Visit (INDEPENDENT_AMBULATORY_CARE_PROVIDER_SITE_OTHER): Payer: Self-pay | Admitting: Cardiovascular Disease

## 2013-04-25 ENCOUNTER — Encounter: Payer: Self-pay | Admitting: *Deleted

## 2013-04-25 VITALS — BP 130/82 | Ht <= 58 in | Wt 188.0 lb

## 2013-04-25 DIAGNOSIS — I10 Essential (primary) hypertension: Secondary | ICD-10-CM

## 2013-04-25 DIAGNOSIS — R079 Chest pain, unspecified: Secondary | ICD-10-CM

## 2013-04-25 DIAGNOSIS — I2510 Atherosclerotic heart disease of native coronary artery without angina pectoris: Secondary | ICD-10-CM

## 2013-04-25 DIAGNOSIS — E079 Disorder of thyroid, unspecified: Secondary | ICD-10-CM

## 2013-04-25 NOTE — Assessment & Plan Note (Signed)
Seems to have some anxiety Will get more records from [facility]  Pain very atypical and recent myovue 2013 normal and ER w/u negative Continue ASA and beta blocker No indication for cath

## 2013-04-25 NOTE — Assessment & Plan Note (Signed)
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

## 2013-04-25 NOTE — Progress Notes (Signed)
Patient ID: Robin Hebert, female   DOB: July 13, 1961, 52 y.o.   MRN: [id number] 52 yo obese black female All previous care in [facility].  Apparently owes money and Dr Sharalyn Ink practice stopped seeing her. She has CAD.  Stent to RCA in 2010.  Stent to different vessel in 2012.  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  Normal myovue 2013.  Seen in ER [facility] 03/10/13 and r/o.  Compliant with meds. Pain is sharp intermitant Worse when she picks things up. Not like when she had stents. No pleurisy.  Pain not necessarily worse with exercise but worse when she picks things up.    ROS: Denies fever, malais, weight loss, blurry vision, decreased visual acuity, cough, sputum, SOB, hemoptysis, pleuritic pain, palpitaitons, heartburn, abdominal pain, melena, lower extremity edema, claudication, or rash.  All other systems reviewed and negative   General: Affect appropriate Obese large chested black femlae HEENT: normal Neck supple with no adenopathy JVP normal no bruits no thyromegaly Lungs clear with no wheezing and good diaphragmatic motion Heart:  S1/S2 no murmur,rub, gallop or click PMI normal Abdomen: benighn, BS positve, no tenderness, no AAA no bruit.  No HSM or HJR Distal pulses intact with no bruits No edema Neuro non-focal Skin warm and dry No muscular weakness  Medications Current Outpatient Prescriptions  Medication Sig Dispense Refill  . ALPRAZolam (XANAX) 0.5 MG tablet Take 0.5 mg by mouth 2 (two) times daily.      Marland Kitchen aspirin 81 MG tablet Take 81 mg by mouth daily.        Marland Kitchen atorvastatin (LIPITOR) 80 MG tablet Take 80 mg by mouth daily.        Marland Kitchen escitalopram (LEXAPRO) 20 MG tablet Take 20 mg by mouth daily.        Marland Kitchen ezetimibe (ZETIA) 10 MG tablet Take 10 mg by mouth daily.        . metoprolol tartrate (LOPRESSOR) 25 MG tablet Take 25 mg by mouth daily.       . naproxen (NAPROSYN) 375 MG tablet Take 375 mg by mouth daily.      . ranitidine (ZANTAC) 150 MG tablet Take 150 mg by mouth 2 (two) times daily.         . traMADol (ULTRAM) 50 MG tablet Take 50 mg by mouth 2 (two) times daily.       No current facility-administered medications for this visit.    Allergies Prednisone  Family History: Family History  Problem Relation Age of Onset  . Alcoholism    . Cancer    . Diabetes    . Heart disease    . Hypertension    . Renal Disease    . Stroke    . Heart failure Mother   . Heart failure Father   . Congestive Heart Failure Sister   . Heart Problems Brother     Social History: History   Social History  . Marital Status: Widowed    Spouse Name: N/A    Number of Children: N/A  . Years of Education: N/A   Occupational History  . Not on file.   Social History Main Topics  . Smoking status: Former Smoker    Quit date: 01/26/2009  . Smokeless tobacco: Not on file  . Alcohol Use: 0.6 oz/week    1 Glasses of wine per week  . Drug Use: No  . Sexually Active: Not on file   Other Topics Concern  . Not on file   Social History Narrative  .  No narrative on file    Electrocardiogram:  11/29/11  SR rate 59 normal ECG  Assessment and Plan

## 2013-04-25 NOTE — Assessment & Plan Note (Signed)
Well controlled.  Continue current medications and low sodium Dash type diet.    

## 2013-04-25 NOTE — Addendum Note (Signed)
Addended by: Micki Riley C on: 04/25/2013 12:06 PM   Modules accepted: Orders

## 2013-04-25 NOTE — Patient Instructions (Signed)
Your physician wants you to follow-up in:  6 MONTHS WITH DR NISHAN  You will receive a reminder letter in the mail two months in advance. If you don't receive a letter, please call our office to schedule the follow-up appointment. Your physician recommends that you continue on your current medications as directed. Please refer to the Current Medication list given to you today. 

## 2013-04-25 NOTE — Assessment & Plan Note (Addendum)
Not on replacement F/U primary for TSH

## 2013-05-01 ENCOUNTER — Encounter: Payer: Self-pay | Admitting: Cardiovascular Disease

## 2013-05-18 ENCOUNTER — Encounter (HOSPITAL_BASED_OUTPATIENT_CLINIC_OR_DEPARTMENT_OTHER): Payer: Self-pay | Admitting: *Deleted

## 2013-05-18 ENCOUNTER — Emergency Department (HOSPITAL_BASED_OUTPATIENT_CLINIC_OR_DEPARTMENT_OTHER)
Admission: EM | Admit: 2013-05-18 | Discharge: 2013-05-18 | Disposition: A | Discharge status: Home / Self Care | Payer: Self-pay | Attending: Emergency Medicine | Admitting: Emergency Medicine

## 2013-05-18 DIAGNOSIS — Z7982 Long term (current) use of aspirin: Secondary | ICD-10-CM | POA: Insufficient documentation

## 2013-05-18 DIAGNOSIS — Z8719 Personal history of other diseases of the digestive system: Secondary | ICD-10-CM | POA: Insufficient documentation

## 2013-05-18 DIAGNOSIS — Z791 Long term (current) use of non-steroidal anti-inflammatories (NSAID): Secondary | ICD-10-CM | POA: Insufficient documentation

## 2013-05-18 DIAGNOSIS — F3289 Other specified depressive episodes: Secondary | ICD-10-CM | POA: Insufficient documentation

## 2013-05-18 DIAGNOSIS — F329 Major depressive disorder, single episode, unspecified: Secondary | ICD-10-CM | POA: Insufficient documentation

## 2013-05-18 DIAGNOSIS — Z862 Personal history of diseases of the blood and blood-forming organs and certain disorders involving the immune mechanism: Secondary | ICD-10-CM | POA: Insufficient documentation

## 2013-05-18 DIAGNOSIS — Z87891 Personal history of nicotine dependence: Secondary | ICD-10-CM | POA: Insufficient documentation

## 2013-05-18 DIAGNOSIS — G5600 Carpal tunnel syndrome, unspecified upper limb: Secondary | ICD-10-CM | POA: Insufficient documentation

## 2013-05-18 DIAGNOSIS — Z79899 Other long term (current) drug therapy: Secondary | ICD-10-CM | POA: Insufficient documentation

## 2013-05-18 DIAGNOSIS — K029 Dental caries, unspecified: Secondary | ICD-10-CM | POA: Insufficient documentation

## 2013-05-18 DIAGNOSIS — I10 Essential (primary) hypertension: Secondary | ICD-10-CM | POA: Insufficient documentation

## 2013-05-18 DIAGNOSIS — Z9861 Coronary angioplasty status: Secondary | ICD-10-CM | POA: Insufficient documentation

## 2013-05-18 DIAGNOSIS — F411 Generalized anxiety disorder: Secondary | ICD-10-CM | POA: Insufficient documentation

## 2013-05-18 DIAGNOSIS — Z8639 Personal history of other endocrine, nutritional and metabolic disease: Secondary | ICD-10-CM | POA: Insufficient documentation

## 2013-05-18 DIAGNOSIS — I2510 Atherosclerotic heart disease of native coronary artery without angina pectoris: Secondary | ICD-10-CM | POA: Insufficient documentation

## 2013-05-18 MED ORDER — OXYCODONE-ACETAMINOPHEN 5-325 MG PO TABS: 2.0000 | ORAL_TABLET | ORAL | Status: DC | PRN

## 2013-05-18 MED ORDER — OXYCODONE-ACETAMINOPHEN 5-325 MG PO TABS
2.0000 | ORAL_TABLET | Freq: Once | ORAL | Status: AC
  Administered 2013-05-18: 2 via ORAL
  Filled 2013-05-18 (×2): qty 2

## 2013-05-18 MED ORDER — PENICILLIN V POTASSIUM 500 MG PO TABS: 500.0000 mg | ORAL_TABLET | Freq: Three times a day (TID) | ORAL | Status: DC

## 2013-05-18 NOTE — ED Notes (Signed)
Pt states she thinks her filling came out. C/O pain to right lower molar x 1 week.

## 2013-05-18 NOTE — ED Provider Notes (Signed)
History     CSN: [id number]  Arrival date & time 05/18/13  1408   First MD Initiated Contact with Patient 05/18/13 1419      Chief Complaint  Patient presents with  . Dental Pain    (Consider location/radiation/quality/duration/timing/severity/associated sxs/prior treatment) HPI Comments: . She states it's been hurting for the last 2 days. She states about a week ago she felt part of the tooth cough. She denies any facial swelling. She denies any fevers, nausea or vomiting. She currently does not have a dentist. She describes her pain as constant and throbbing.  Patient is a 52 y.o. female presenting with tooth pain.  Dental Pain Associated symptoms: no drooling, no fever and no headaches     Past Medical History  Diagnosis Date  . Coronary artery disease   . Hypertension   . Thyroid disease   . Depression   . Acid reflux   . Anxiety   . Plantar fasciitis   . Sciatica   . Carpal tunnel syndrome   . History of anemia   . CAD (coronary artery disease)     Past Surgical History  Procedure Laterality Date  . Coronary angioplasty with stent placement  02/17/2009  . Tubal ligation      Family History  Problem Relation Age of Onset  . Alcoholism    . Cancer    . Diabetes    . Heart disease    . Hypertension    . Renal Disease    . Stroke    . Heart failure Mother   . Heart failure Father   . Congestive Heart Failure Sister   . Heart Problems Brother     History  Substance Use Topics  . Smoking status: Former Smoker    Quit date: 01/26/2009  . Smokeless tobacco: Not on file  . Alcohol Use: 0.6 oz/week    1 Glasses of wine per week    OB History   Grav Para Term Preterm Abortions TAB SAB Ect Mult Living                  Review of Systems  Constitutional: Negative for fever.  HENT: Positive for dental problem. Negative for drooling.   Gastrointestinal: Negative for nausea and vomiting.  Skin: Negative for rash.  Neurological: Negative for  light-headedness and headaches.    Allergies  Prednisone  Home Medications   Current Outpatient Rx  Name  Route  Sig  Dispense  Refill  . ALPRAZolam (XANAX) 0.5 MG tablet   Oral   Take 0.5 mg by mouth 2 (two) times daily.         Marland Kitchen aspirin 81 MG tablet   Oral   Take 81 mg by mouth daily.           Marland Kitchen atorvastatin (LIPITOR) 80 MG tablet   Oral   Take 80 mg by mouth daily.           Marland Kitchen escitalopram (LEXAPRO) 20 MG tablet   Oral   Take 20 mg by mouth daily.           Marland Kitchen ezetimibe (ZETIA) 10 MG tablet   Oral   Take 10 mg by mouth daily.           . metoprolol tartrate (LOPRESSOR) 25 MG tablet   Oral   Take 25 mg by mouth daily.          . naproxen (NAPROSYN) 375 MG tablet   Oral   Take  375 mg by mouth daily.         Marland Kitchen oxyCODONE-acetaminophen (PERCOCET) 5-325 MG per tablet   Oral   Take 2 tablets by mouth every 4 (four) hours as needed for pain.   15 tablet   0   . penicillin v potassium (VEETID) 500 MG tablet   Oral   Take 1 tablet (500 mg total) by mouth 3 (three) times daily.   30 tablet   0   . ranitidine (ZANTAC) 150 MG tablet   Oral   Take 150 mg by mouth 2 (two) times daily.           . traMADol (ULTRAM) 50 MG tablet   Oral   Take 50 mg by mouth 2 (two) times daily.           BP 176/91  Pulse 60  Temp(Src) 98.4 F (36.9 C) (Oral)  Resp 20  Ht 4\' 10"  (1.473 m)  Wt 186 lb (84.369 kg)  BMI 38.88 kg/m2  SpO2 95%  LMP 02/18/2013  Physical Exam  Constitutional: She is oriented to person, place, and time. She appears well-developed and well-nourished.  HENT:  Patient has tenderness along the right lower second molar. There some mild erythema around the area. There's no swelling, fluctuance or induration. There's no trismus present. There is a filling in place however part of the tooth has broken off.  Cardiovascular: Normal rate.   Pulmonary/Chest: Effort normal.  Neurological: She is alert and oriented to person, place, and time.   Skin: Skin is warm and dry.    ED Course  Procedures (including critical care time)  Labs Reviewed - No data to display No results found.   1. Dental caries       MDM  Patient was given penicillin and Percocet. She was advised to followup with the dental clinic.  She was advised she is to call within the next 48 hours. She also was advised that her blood pressure needs to be rechecked by her primary care physician or cardiologist that she's currently seeing.        Rolan Bucco, MD 05/18/13 [phone]

## 2013-05-21 ENCOUNTER — Telehealth (HOSPITAL_COMMUNITY): Payer: Self-pay | Admitting: Emergency Medicine

## 2013-05-21 NOTE — ED Notes (Signed)
Call from on call dentist requesting demographics and referral be faxed to their office. 

## 2013-09-19 ENCOUNTER — Encounter (HOSPITAL_BASED_OUTPATIENT_CLINIC_OR_DEPARTMENT_OTHER): Payer: Self-pay | Admitting: Student

## 2013-09-19 ENCOUNTER — Emergency Department (HOSPITAL_BASED_OUTPATIENT_CLINIC_OR_DEPARTMENT_OTHER)
Admission: EM | Admit: 2013-09-19 | Discharge: 2013-09-19 | Disposition: A | Discharge status: Home / Self Care | Payer: Medicaid Other | Attending: Emergency Medicine | Admitting: Emergency Medicine

## 2013-09-19 DIAGNOSIS — Z862 Personal history of diseases of the blood and blood-forming organs and certain disorders involving the immune mechanism: Secondary | ICD-10-CM | POA: Insufficient documentation

## 2013-09-19 DIAGNOSIS — K219 Gastro-esophageal reflux disease without esophagitis: Secondary | ICD-10-CM | POA: Insufficient documentation

## 2013-09-19 DIAGNOSIS — B86 Scabies: Secondary | ICD-10-CM | POA: Insufficient documentation

## 2013-09-19 DIAGNOSIS — Z7982 Long term (current) use of aspirin: Secondary | ICD-10-CM | POA: Insufficient documentation

## 2013-09-19 DIAGNOSIS — Z8639 Personal history of other endocrine, nutritional and metabolic disease: Secondary | ICD-10-CM | POA: Insufficient documentation

## 2013-09-19 DIAGNOSIS — F411 Generalized anxiety disorder: Secondary | ICD-10-CM | POA: Insufficient documentation

## 2013-09-19 DIAGNOSIS — Z791 Long term (current) use of non-steroidal anti-inflammatories (NSAID): Secondary | ICD-10-CM | POA: Insufficient documentation

## 2013-09-19 DIAGNOSIS — Z792 Long term (current) use of antibiotics: Secondary | ICD-10-CM | POA: Insufficient documentation

## 2013-09-19 DIAGNOSIS — Z79899 Other long term (current) drug therapy: Secondary | ICD-10-CM | POA: Insufficient documentation

## 2013-09-19 DIAGNOSIS — F329 Major depressive disorder, single episode, unspecified: Secondary | ICD-10-CM | POA: Insufficient documentation

## 2013-09-19 DIAGNOSIS — Z8719 Personal history of other diseases of the digestive system: Secondary | ICD-10-CM | POA: Insufficient documentation

## 2013-09-19 DIAGNOSIS — Z8669 Personal history of other diseases of the nervous system and sense organs: Secondary | ICD-10-CM | POA: Insufficient documentation

## 2013-09-19 DIAGNOSIS — Z8739 Personal history of other diseases of the musculoskeletal system and connective tissue: Secondary | ICD-10-CM | POA: Insufficient documentation

## 2013-09-19 DIAGNOSIS — I2510 Atherosclerotic heart disease of native coronary artery without angina pectoris: Secondary | ICD-10-CM | POA: Insufficient documentation

## 2013-09-19 DIAGNOSIS — F3289 Other specified depressive episodes: Secondary | ICD-10-CM | POA: Insufficient documentation

## 2013-09-19 DIAGNOSIS — I10 Essential (primary) hypertension: Secondary | ICD-10-CM | POA: Insufficient documentation

## 2013-09-19 DIAGNOSIS — Z9861 Coronary angioplasty status: Secondary | ICD-10-CM | POA: Insufficient documentation

## 2013-09-19 DIAGNOSIS — Z87891 Personal history of nicotine dependence: Secondary | ICD-10-CM | POA: Insufficient documentation

## 2013-09-19 MED ORDER — PERMETHRIN 5 % EX CREA: TOPICAL_CREAM | CUTANEOUS | Status: DC

## 2013-09-19 MED ORDER — HYDROXYZINE HCL 25 MG PO TABS: 25.0000 mg | ORAL_TABLET | Freq: Four times a day (QID) | ORAL | Status: DC

## 2013-09-19 NOTE — ED Notes (Signed)
Rash, possible mosquito bites to both arms

## 2013-09-19 NOTE — ED Provider Notes (Signed)
CSN: [id number]     Arrival date & time 09/19/13  1816 History  This chart was scribed for Rolan Bucco, MD by Caryn Bee, ED Scribe. This patient was seen in room MH08/MH08 and the patient's care was started 6:33 PM.    Chief Complaint  Patient presents with  . Rash    both arms    Patient is a 52 y.o. female presenting with rash. The history is provided by the patient. No language interpreter was used.  Rash Location:  Shoulder/arm Shoulder/arm rash location:  R arm, L arm, R upper arm, L upper arm, R forearm and L forearm Quality: itchiness   Severity:  Mild Onset quality:  Sudden Duration:  1 week Timing:  Constant Progression:  Unchanged Chronicity:  New Relieved by:  None tried Ineffective treatments:  None tried Associated symptoms: no abdominal pain, no diarrhea, no fatigue, no fever, no headaches, no joint pain, no nausea, no shortness of breath and not vomiting    HPI Comments: Robin Hebert is a 52 y.o. female who presents to the Emergency Department complaining of sudden onset, constant itchy rash on bilateral arms that began 1 week ago. Pt states that she believes the rash is from fleas or mosquitoes. Pt denies any fever, SOB, rash in any other areas, or any other symptoms. She denies any shortness of breath or wheezing. She denies any known contacts with a similar rash. Pt is allergic to Prednisone. Pt's PCP is an adult [facility] clinic.    Past Medical History  Diagnosis Date  . Coronary artery disease   . Hypertension   . Thyroid disease   . Depression   . Acid reflux   . Anxiety   . Plantar fasciitis   . Sciatica   . Carpal tunnel syndrome   . History of anemia   . CAD (coronary artery disease)    Past Surgical History  Procedure Laterality Date  . Coronary angioplasty with stent placement  02/17/2009  . Tubal ligation     Family History  Problem Relation Age of Onset  . Alcoholism    . Cancer    . Diabetes    . Heart disease    . Hypertension     . Renal Disease    . Stroke    . Heart failure Mother   . Heart failure Father   . Congestive Heart Failure Sister   . Heart Problems Brother    History  Substance Use Topics  . Smoking status: Former Smoker    Quit date: 01/26/2009  . Smokeless tobacco: Not on file  . Alcohol Use: 0.6 oz/week    1 Glasses of wine per week   OB History   Grav Para Term Preterm Abortions TAB SAB Ect Mult Living                 Review of Systems  Constitutional: Negative for fever, chills, diaphoresis and fatigue.  HENT: Negative for congestion, rhinorrhea and sneezing.   Eyes: Negative.   Respiratory: Negative for cough, chest tightness and shortness of breath.   Cardiovascular: Negative for chest pain and leg swelling.  Gastrointestinal: Negative for nausea, vomiting, abdominal pain, diarrhea and blood in stool.  Genitourinary: Negative for frequency, hematuria, flank pain and difficulty urinating.  Musculoskeletal: Negative for back pain and arthralgias.  Skin: Positive for rash.  Neurological: Negative for dizziness, speech difficulty, weakness, numbness and headaches.    Allergies  Prednisone  Home Medications   Current Outpatient Rx  Name  Route  Sig  Dispense  Refill  . ALPRAZolam (XANAX) 0.5 MG tablet   Oral   Take 0.5 mg by mouth 2 (two) times daily.         Marland Kitchen aspirin 81 MG tablet   Oral   Take 81 mg by mouth daily.           Marland Kitchen atorvastatin (LIPITOR) 80 MG tablet   Oral   Take 80 mg by mouth daily.           Marland Kitchen escitalopram (LEXAPRO) 20 MG tablet   Oral   Take 20 mg by mouth daily.           Marland Kitchen ezetimibe (ZETIA) 10 MG tablet   Oral   Take 10 mg by mouth daily.           . hydrOXYzine (ATARAX/VISTARIL) 25 MG tablet   Oral   Take 1 tablet (25 mg total) by mouth every 6 (six) hours.   12 tablet   0   . metoprolol tartrate (LOPRESSOR) 25 MG tablet   Oral   Take 25 mg by mouth daily.          . naproxen (NAPROSYN) 375 MG tablet   Oral   Take 375 mg  by mouth daily.         Marland Kitchen oxyCODONE-acetaminophen (PERCOCET) 5-325 MG per tablet   Oral   Take 2 tablets by mouth every 4 (four) hours as needed for pain.   15 tablet   0   . penicillin v potassium (VEETID) 500 MG tablet   Oral   Take 1 tablet (500 mg total) by mouth 3 (three) times daily.   30 tablet   0   . permethrin (ELIMITE) 5 % cream      Apply from head to toe, leave on 8 hours and wash off.   60 g   0   . ranitidine (ZANTAC) 150 MG tablet   Oral   Take 150 mg by mouth 2 (two) times daily.           . traMADol (ULTRAM) 50 MG tablet   Oral   Take 50 mg by mouth 2 (two) times daily.          Triage Vitals: BP 129/98  Pulse 72  Temp(Src) 98.5 F (36.9 C) (Oral)  Resp 18  Ht 4\' 10"  (1.473 m)  Wt 189 lb (85.73 kg)  BMI 39.51 kg/m2  SpO2 100%  Physical Exam  Constitutional: She is oriented to person, place, and time. She appears well-developed and well-nourished.  HENT:  Head: Normocephalic and atraumatic.  Eyes: Pupils are equal, round, and reactive to light.  Neck: Normal range of motion. Neck supple.  Cardiovascular: Normal rate, regular rhythm and normal heart sounds.   Pulmonary/Chest: Effort normal and breath sounds normal. No respiratory distress. She has no wheezes. She has no rales. She exhibits no tenderness.  Abdominal: Soft. Bowel sounds are normal. There is no tenderness. There is no rebound and no guarding.  Musculoskeletal: Normal range of motion. She exhibits no edema.  Lymphadenopathy:    She has no cervical adenopathy.  Neurological: She is alert and oriented to person, place, and time.  Skin: Skin is warm and dry. Rash noted.  Patient has several small papules with excoriated areas on both arms. There's no rash in the web space of the fingers. I don't see any definite burrows. There is no signs of infection. There is no other rashes noted.  Psychiatric: She has a normal mood and affect.    ED Course  Procedures (including critical  care time) DIAGNOSTIC STUDIES: Oxygen Saturation is 100% on room air, normal by my interpretation.    COORDINATION OF CARE: 6:38 PM-Discussed treatment plan which includes prescription for cream to kill mites and medication for itching with pt at bedside and pt agreed to plan.   Labs Review Labs Reviewed - No data to display Imaging Review No results found.  MDM   1. Scabies    Patient is a rash that looks suspicious for scabies. It did not appear to be consistent with mosquito bites although it could be potentially fleabites. Given that it does have the appearance of scabies I did recommend treatment with Elimite cream. I advised her to wash all of her clothes and linens in hot water. I gave her prescription for Atarax. I gave her a referral to followup with a dermatologist if her symptoms are not improving with this or otherwise return here for symptoms worsen.   I personally performed the services described in this documentation, which was scribed in my presence.  The recorded information has been reviewed and considered.    Rolan Bucco, MD 09/19/13 1850

## 2015-05-19 ENCOUNTER — Emergency Department (HOSPITAL_BASED_OUTPATIENT_CLINIC_OR_DEPARTMENT_OTHER): Payer: Medicare HMO

## 2015-05-19 ENCOUNTER — Encounter (HOSPITAL_BASED_OUTPATIENT_CLINIC_OR_DEPARTMENT_OTHER): Payer: Self-pay | Admitting: *Deleted

## 2015-05-19 ENCOUNTER — Emergency Department (HOSPITAL_BASED_OUTPATIENT_CLINIC_OR_DEPARTMENT_OTHER)
Admission: EM | Admit: 2015-05-19 | Discharge: 2015-05-19 | Disposition: A | Discharge status: Home / Self Care | Payer: Medicare HMO | Attending: Emergency Medicine | Admitting: Emergency Medicine

## 2015-05-19 DIAGNOSIS — Z8739 Personal history of other diseases of the musculoskeletal system and connective tissue: Secondary | ICD-10-CM | POA: Insufficient documentation

## 2015-05-19 DIAGNOSIS — Z8639 Personal history of other endocrine, nutritional and metabolic disease: Secondary | ICD-10-CM | POA: Insufficient documentation

## 2015-05-19 DIAGNOSIS — F419 Anxiety disorder, unspecified: Secondary | ICD-10-CM | POA: Diagnosis not present

## 2015-05-19 DIAGNOSIS — Z862 Personal history of diseases of the blood and blood-forming organs and certain disorders involving the immune mechanism: Secondary | ICD-10-CM | POA: Diagnosis not present

## 2015-05-19 DIAGNOSIS — Z79899 Other long term (current) drug therapy: Secondary | ICD-10-CM | POA: Insufficient documentation

## 2015-05-19 DIAGNOSIS — K219 Gastro-esophageal reflux disease without esophagitis: Secondary | ICD-10-CM | POA: Diagnosis not present

## 2015-05-19 DIAGNOSIS — G4489 Other headache syndrome: Secondary | ICD-10-CM | POA: Insufficient documentation

## 2015-05-19 DIAGNOSIS — F329 Major depressive disorder, single episode, unspecified: Secondary | ICD-10-CM | POA: Diagnosis not present

## 2015-05-19 DIAGNOSIS — I10 Essential (primary) hypertension: Secondary | ICD-10-CM | POA: Insufficient documentation

## 2015-05-19 DIAGNOSIS — Z9861 Coronary angioplasty status: Secondary | ICD-10-CM | POA: Insufficient documentation

## 2015-05-19 DIAGNOSIS — Z792 Long term (current) use of antibiotics: Secondary | ICD-10-CM | POA: Insufficient documentation

## 2015-05-19 DIAGNOSIS — I2510 Atherosclerotic heart disease of native coronary artery without angina pectoris: Secondary | ICD-10-CM | POA: Diagnosis not present

## 2015-05-19 DIAGNOSIS — Z87891 Personal history of nicotine dependence: Secondary | ICD-10-CM | POA: Insufficient documentation

## 2015-05-19 DIAGNOSIS — Z7982 Long term (current) use of aspirin: Secondary | ICD-10-CM | POA: Diagnosis not present

## 2015-05-19 DIAGNOSIS — R51 Headache: Secondary | ICD-10-CM | POA: Diagnosis present

## 2015-05-19 HISTORY — DX: Reserved for concepts with insufficient information to code with codable children: IMO0002

## 2015-05-19 HISTORY — DX: Systemic lupus erythematosus, unspecified: M32.9

## 2015-05-19 MED ORDER — METOCLOPRAMIDE HCL 5 MG/ML IJ SOLN
10.0000 mg | Freq: Once | INTRAMUSCULAR | Status: AC
  Administered 2015-05-19: 10 mg via INTRAMUSCULAR
  Filled 2015-05-19: qty 2

## 2015-05-19 MED ORDER — DIPHENHYDRAMINE HCL 50 MG/ML IJ SOLN
25.0000 mg | Freq: Once | INTRAMUSCULAR | Status: AC
  Administered 2015-05-19: 25 mg via INTRAMUSCULAR
  Filled 2015-05-19: qty 1

## 2015-05-19 NOTE — ED Notes (Signed)
MD at bedside. 

## 2015-05-19 NOTE — ED Notes (Signed)
Pt c/o elevated BP and HA x1 week.

## 2015-05-19 NOTE — ED Provider Notes (Signed)
CSN: [id number]     Arrival date & time 05/19/15  2141 History  This chart was scribed for Zadie Rhine, MD by Richarda Overlie, ED Scribe. This patient was seen in room MH02/MH02 and the patient's care was started 10:42 PM.     Chief Complaint  Patient presents with  . Hypertension   Patient is a 54 y.o. female presenting with hypertension and headaches.  Hypertension This is a recurrent problem. The current episode started more than 2 days ago. The problem occurs constantly. The problem has not changed since onset.Associated symptoms include headaches. Pertinent negatives include no chest pain and no shortness of breath.  Headache Pain location:  Frontal Onset quality:  Unable to specify Duration:  1 week Progression:  Unchanged Chronicity:  New Associated symptoms: nausea   Associated symptoms: no back pain, no fever, no numbness and no vomiting    HPI Comments: Robin Hebert is a 54 y.o. female with a history of CAD, lupus and HTN who presents to the Emergency Department complaining of elevated blood pressure for the last 1 week. Pt states she has had persistent, frontal HAs since that time as well. Pt reports she had an endoscopy 1 week ago. She states that she experiences some nausea and blurred vision with her HAs as well. pt states that she fell 5 days ago but states she did not hit her head. Pt denies any hx of CVA or brain injuries. She denies any recent travel or tick bites. She reports she is currently taking Plavix. She denies fever, vomiting, CP, numbness in her lower extremities, back pain or SOB.   Past Medical History  Diagnosis Date  . Coronary artery disease   . Hypertension   . Thyroid disease   . Depression   . Acid reflux   . Anxiety   . Plantar fasciitis   . Sciatica   . Carpal tunnel syndrome   . History of anemia   . CAD (coronary artery disease)   . Lupus    Past Surgical History  Procedure Laterality Date  . Coronary angioplasty with stent placement   02/17/2009  . Tubal ligation     Family History  Problem Relation Age of Onset  . Alcoholism    . Cancer    . Diabetes    . Heart disease    . Hypertension    . Renal Disease    . Stroke    . Heart failure Mother   . Heart failure Father   . Congestive Heart Failure Sister   . Heart Problems Brother    History  Substance Use Topics  . Smoking status: Former Smoker    Quit date: 01/26/2009  . Smokeless tobacco: Not on file  . Alcohol Use: 0.6 oz/week    1 Glasses of wine per week   OB History    No data available     Review of Systems  Constitutional: Negative for fever.  Eyes: Positive for visual disturbance.  Respiratory: Negative for shortness of breath.   Cardiovascular: Negative for chest pain.  Gastrointestinal: Positive for nausea. Negative for vomiting.  Musculoskeletal: Negative for back pain.  Neurological: Positive for headaches. Negative for numbness.  All other systems reviewed and are negative.  Allergies  Prednisone  Home Medications   Prior to Admission medications   Medication Sig Start Date End Date Taking? Authorizing Provider  ALPRAZolam Prudy Feeler) 0.5 MG tablet Take 0.5 mg by mouth 2 (two) times daily.   Yes Historical Provider, MD  aspirin 81 MG tablet Take 81 mg by mouth daily.     Yes Historical Provider, MD  clopidogrel (PLAVIX) 75 MG tablet Take 75 mg by mouth daily.   Yes Historical Provider, MD  escitalopram (LEXAPRO) 20 MG tablet Take 20 mg by mouth daily.     Yes Historical Provider, MD  esomeprazole (NEXIUM) 40 MG capsule Take 40 mg by mouth daily at 12 noon.   Yes Historical Provider, MD  ezetimibe (ZETIA) 10 MG tablet Take 10 mg by mouth daily.     Yes Historical Provider, MD  lovastatin (MEVACOR) 10 MG tablet Take 10 mg by mouth at bedtime.   Yes Historical Provider, MD  metoprolol tartrate (LOPRESSOR) 25 MG tablet Take 25 mg by mouth daily.    Yes Historical Provider, MD  naproxen (NAPROSYN) 375 MG tablet Take 375 mg by mouth daily.    Yes Historical Provider, MD  traMADol (ULTRAM) 50 MG tablet Take 50 mg by mouth 2 (two) times daily.   Yes Historical Provider, MD  atorvastatin (LIPITOR) 80 MG tablet Take 80 mg by mouth daily.      Historical Provider, MD  hydrOXYzine (ATARAX/VISTARIL) 25 MG tablet Take 1 tablet (25 mg total) by mouth every 6 (six) hours. 09/19/13   Rolan BuccoMelanie Belfi, MD  oxyCODONE-acetaminophen (PERCOCET) 5-325 MG per tablet Take 2 tablets by mouth every 4 (four) hours as needed for pain. 05/18/13   Rolan BuccoMelanie Belfi, MD  penicillin v potassium (VEETID) 500 MG tablet Take 1 tablet (500 mg total) by mouth 3 (three) times daily. 05/18/13   Rolan BuccoMelanie Belfi, MD  permethrin (ELIMITE) 5 % cream Apply from head to toe, leave on 8 hours and wash off. 09/19/13   Rolan BuccoMelanie Belfi, MD  ranitidine (ZANTAC) 150 MG tablet Take 150 mg by mouth 2 (two) times daily.      Historical Provider, MD   BP 123/72 mmHg  Pulse 72  Temp(Src) 98.7 F (37.1 C) (Oral)  Resp 20  Ht 4\' 10"  (1.473 m)  Wt 190 lb (86.183 kg)  BMI 39.72 kg/m2  SpO2 97%  LMP 02/18/2013 Physical Exam  CONSTITUTIONAL: Well developed/well nourished HEAD: Normocephalic/atraumatic EYES: EOMI/PERRL, no nystagmus, no ptosis ENMT: Mucous membranes moist NECK: supple no meningeal signs, no bruits SPINE/BACK:entire spine nontender CV: S1/S2 noted, no murmurs/rubs/gallops noted LUNGS: Lungs are clear to auscultation bilaterally, no apparent distress ABDOMEN: soft, nontender, no rebound or guarding GU:no cva tenderness NEURO:Awake/alert, facies symmetric, no arm or leg drift is noted Equal 5/5 strength with shoulder abduction, elbow flex/extension, wrist flex/extension in upper extremities and equal hand grips bilaterally Equal 5/5 strength with hip flexion,knee flex/extension, foot dorsi/plantar flexion Cranial nerves 3/4/5/6/07/02/09/11/12 tested and intact Gait normal without ataxia No past pointing Sensation to light touch intact in all extremities EXTREMITIES: pulses  normal, full ROM SKIN: warm, color normal PSYCH: no abnormalities of mood noted, alert and oriented to situation  ED Course  Procedures   DIAGNOSTIC STUDIES: Oxygen Saturation is 97% on RA, normal by my interpretation.    COORDINATION OF CARE: 10:50 PM Discussed treatment plan with pt at bedside and pt agreed to plan.  11:39 PM CT Head performed due to persistent HA in a patient with multiple medical problems and elevated Blood pressure at home and also over age of 51 Pt improved after meds She is well appearing No signs of acute stroke No signs of meningitis.  No signs of acute neurologic emergency Appropriate for d/c home  Discussed need for PCP followup   Labs Review  Labs Reviewed - No data to display  Imaging Review Ct Head Wo Contrast  05/19/2015   CLINICAL DATA:  Severe headache and hypertension for 1 week.  EXAM: CT HEAD WITHOUT CONTRAST  TECHNIQUE: Contiguous axial images were obtained from the base of the skull through the vertex without intravenous contrast.  COMPARISON:  None.  FINDINGS: Great differentiation is maintained. No CT evidence of acute large territory infarct. No intraparenchymal extra-axial mass or hemorrhage. Normal size and configuration of the ventricles and basilar cisterns. No midline shift. There is under pneumatization of the bilateral frontal sinuses. The remaining paranasal sinuses are normally aerated. No air-fluid levels. Regional soft tissues appear normal. Post bilateral cataract surgery. No displaced calvarial fracture.  IMPRESSION: Negative noncontrast head CT.   Electronically Signed   By: Simonne Come M.D.   On: 05/19/2015 23:19     Medications  metoCLOPramide (REGLAN) injection 10 mg (10 mg Intramuscular Given 05/19/15 2253)  diphenhydrAMINE (BENADRYL) injection 25 mg (25 mg Intramuscular Given 05/19/15 2253)    MDM   Final diagnoses:  Other headache syndrome    Nursing notes including past medical history and social history reviewed  and considered in documentation  I personally performed the services described in this documentation, which was scribed in my presence. The recorded information has been reviewed and is accurate.        Zadie Rhine, MD 05/19/15 [phone]

## 2015-05-19 NOTE — ED Notes (Signed)
Patient transported to CT 

## 2015-05-19 NOTE — Discharge Instructions (Signed)

## 2015-06-05 ENCOUNTER — Emergency Department (HOSPITAL_BASED_OUTPATIENT_CLINIC_OR_DEPARTMENT_OTHER)
Admission: EM | Admit: 2015-06-05 | Discharge: 2015-06-06 | Disposition: A | Discharge status: Home / Self Care | Payer: Medicare HMO | Attending: Emergency Medicine | Admitting: Emergency Medicine

## 2015-06-05 ENCOUNTER — Emergency Department (HOSPITAL_BASED_OUTPATIENT_CLINIC_OR_DEPARTMENT_OTHER): Payer: Medicare HMO

## 2015-06-05 ENCOUNTER — Encounter (HOSPITAL_BASED_OUTPATIENT_CLINIC_OR_DEPARTMENT_OTHER): Payer: Self-pay | Admitting: *Deleted

## 2015-06-05 DIAGNOSIS — K219 Gastro-esophageal reflux disease without esophagitis: Secondary | ICD-10-CM | POA: Insufficient documentation

## 2015-06-05 DIAGNOSIS — M5430 Sciatica, unspecified side: Secondary | ICD-10-CM | POA: Insufficient documentation

## 2015-06-05 DIAGNOSIS — M26629 Arthralgia of temporomandibular joint, unspecified side: Secondary | ICD-10-CM

## 2015-06-05 DIAGNOSIS — Z7901 Long term (current) use of anticoagulants: Secondary | ICD-10-CM | POA: Insufficient documentation

## 2015-06-05 DIAGNOSIS — R6884 Jaw pain: Secondary | ICD-10-CM | POA: Diagnosis present

## 2015-06-05 DIAGNOSIS — M2662 Arthralgia of temporomandibular joint: Secondary | ICD-10-CM | POA: Insufficient documentation

## 2015-06-05 DIAGNOSIS — F419 Anxiety disorder, unspecified: Secondary | ICD-10-CM | POA: Diagnosis not present

## 2015-06-05 DIAGNOSIS — F430 Acute stress reaction: Secondary | ICD-10-CM | POA: Insufficient documentation

## 2015-06-05 DIAGNOSIS — Z7982 Long term (current) use of aspirin: Secondary | ICD-10-CM | POA: Diagnosis not present

## 2015-06-05 DIAGNOSIS — Z79899 Other long term (current) drug therapy: Secondary | ICD-10-CM | POA: Insufficient documentation

## 2015-06-05 DIAGNOSIS — Z9861 Coronary angioplasty status: Secondary | ICD-10-CM | POA: Insufficient documentation

## 2015-06-05 DIAGNOSIS — Z8669 Personal history of other diseases of the nervous system and sense organs: Secondary | ICD-10-CM | POA: Diagnosis not present

## 2015-06-05 DIAGNOSIS — Z658 Other specified problems related to psychosocial circumstances: Secondary | ICD-10-CM

## 2015-06-05 DIAGNOSIS — I10 Essential (primary) hypertension: Secondary | ICD-10-CM | POA: Diagnosis not present

## 2015-06-05 DIAGNOSIS — F329 Major depressive disorder, single episode, unspecified: Secondary | ICD-10-CM | POA: Diagnosis not present

## 2015-06-05 DIAGNOSIS — I2510 Atherosclerotic heart disease of native coronary artery without angina pectoris: Secondary | ICD-10-CM | POA: Diagnosis not present

## 2015-06-05 DIAGNOSIS — Z87891 Personal history of nicotine dependence: Secondary | ICD-10-CM | POA: Diagnosis not present

## 2015-06-05 DIAGNOSIS — E119 Type 2 diabetes mellitus without complications: Secondary | ICD-10-CM | POA: Diagnosis not present

## 2015-06-05 HISTORY — DX: Type 2 diabetes mellitus without complications: E11.9

## 2015-06-05 LAB — CBC WITH DIFFERENTIAL/PLATELET
BASOS ABS: 0 10*3/uL (ref 0.0–0.1)
Basophils Relative: 0 % (ref 0–1)
Eosinophils Absolute: 0 10*3/uL (ref 0.0–0.7)
Eosinophils Relative: 0 % (ref 0–5)
HCT: 37 % (ref 36.0–46.0)
HEMOGLOBIN: 11.9 g/dL — AB (ref 12.0–15.0)
LYMPHS ABS: 1.5 10*3/uL (ref 0.7–4.0)
Lymphocytes Relative: 31 % (ref 12–46)
MCH: 26.8 pg (ref 26.0–34.0)
MCHC: 32.2 g/dL (ref 30.0–36.0)
MCV: 83.3 fL (ref 78.0–100.0)
MONO ABS: 0.4 10*3/uL (ref 0.1–1.0)
Monocytes Relative: 8 % (ref 3–12)
NEUTROS ABS: 2.9 10*3/uL (ref 1.7–7.7)
Neutrophils Relative %: 61 % (ref 43–77)
Platelets: 199 10*3/uL (ref 150–400)
RBC: 4.44 MIL/uL (ref 3.87–5.11)
RDW: 16.8 % — AB (ref 11.5–15.5)
WBC: 4.8 10*3/uL (ref 4.0–10.5)

## 2015-06-05 LAB — COMPREHENSIVE METABOLIC PANEL
ALK PHOS: 43 U/L (ref 38–126)
ALT: 24 U/L (ref 14–54)
ANION GAP: 6 (ref 5–15)
AST: 21 U/L (ref 15–41)
Albumin: 3.8 g/dL (ref 3.5–5.0)
BUN: 17 mg/dL (ref 6–20)
CO2: 25 mmol/L (ref 22–32)
Calcium: 8.3 mg/dL — ABNORMAL LOW (ref 8.9–10.3)
Chloride: 106 mmol/L (ref 101–111)
Creatinine, Ser: 1.06 mg/dL — ABNORMAL HIGH (ref 0.44–1.00)
GFR calc non Af Amer: 59 mL/min — ABNORMAL LOW (ref >60)
Glucose, Bld: 138 mg/dL — ABNORMAL HIGH (ref 65–99)
POTASSIUM: 3.9 mmol/L (ref 3.5–5.1)
SODIUM: 137 mmol/L (ref 135–145)
Total Bilirubin: 0.4 mg/dL (ref 0.3–1.2)
Total Protein: 7.8 g/dL (ref 6.5–8.1)

## 2015-06-05 LAB — TROPONIN I: Troponin I: <0.03 ng/mL (ref <0.031)

## 2015-06-05 MED ORDER — ASPIRIN 81 MG PO CHEW
324.0000 mg | CHEWABLE_TABLET | Freq: Once | ORAL | Status: AC
  Administered 2015-06-05: 324 mg via ORAL
  Filled 2015-06-05: qty 4

## 2015-06-05 MED ORDER — NITROGLYCERIN 0.4 MG SL SUBL
0.4000 mg | SUBLINGUAL_TABLET | SUBLINGUAL | Status: DC | PRN
  Administered 2015-06-05: 0.4 mg via SUBLINGUAL

## 2015-06-05 NOTE — ED Notes (Signed)
EDP at BS 

## 2015-06-05 NOTE — ED Notes (Signed)
C/o L side facial pain, also HA and CP, chest fluttering, onset this am at 1100. Left [facility]PR [facility] ED d/t wait. Also reports 2d h/o pressure in back, some dizziness, nausea and sob. No dyspnea noted. (denies: vomiting, cough, congestion, fever or diarrhea)

## 2015-06-05 NOTE — Discharge Instructions (Signed)
Suspected Temporomandibular Problems  Temporomandibular joint (TMJ) dysfunction means there are problems with the joint between your jaw and your skull. This is a joint lined by cartilage like other joints in your body but also has a small disc in the joint which keeps the bones from rubbing on each other. These joints are like other joints and can get inflamed (sore) from arthritis and other problems. When this joint gets sore, it can cause headaches and pain in the jaw and the face. CAUSES  Usually the arthritic types of problems are caused by soreness in the joint. Soreness in the joint can also be caused by overuse. This may come from grinding your teeth. It may also come from mis-alignment in the joint. DIAGNOSIS Diagnosis of this condition can often be made by history and exam. Sometimes your caregiver may need X-rays or an MRI scan to determine the exact cause. It may be necessary to see your dentist to determine if your teeth and jaws are lined up correctly. TREATMENT  Most of the time this problem is not serious; however, sometimes it can persist (become chronic). When this happens medications that will cut down on inflammation (soreness) help. Sometimes a shot of cortisone into the joint will be helpful. If your teeth are not aligned it may help for your dentist to make a splint for your mouth that can help this problem. If no physical problems can be found, the problem may come from tension. If tension is found to be the cause, biofeedback or relaxation techniques may be helpful. HOME CARE INSTRUCTIONS   Later in the day, applications of ice packs may be helpful. Ice can be used in a plastic bag with a towel around it to prevent frostbite to skin. This may be used about every 2 hours for 20 to 30 minutes, as needed while awake, or as directed by your caregiver.  Only take over-the-counter or prescription medicines for pain, discomfort, or fever as directed by your caregiver.  If physical  therapy was prescribed, follow your caregiver's directions.  Wear mouth appliances as directed if they were given. Document Released: 09/05/2001 Document Revised: 03/04/2012 Document Reviewed: 12/13/2008 [facility] Patient Information 2015 [place], [place]. This information is not intended to replace advice given to you by your health care provider. Make sure you discuss any questions you have with your health care provider. Chest Pain (Nonspecific) It is often hard to give a specific diagnosis for the cause of chest pain. There is always a chance that your pain could be related to something serious, such as a heart attack or a blood clot in the lungs. You need to follow up with your health care provider for further evaluation. CAUSES   Heartburn.  Pneumonia or bronchitis.  Anxiety or stress.  Inflammation around your heart (pericarditis) or lung (pleuritis or pleurisy).  A blood clot in the lung.  A collapsed lung (pneumothorax). It can develop suddenly on its own (spontaneous pneumothorax) or from trauma to the chest.  Shingles infection (herpes zoster virus). The chest wall is composed of bones, muscles, and cartilage. Any of these can be the source of the pain.  The bones can be bruised by injury.  The muscles or cartilage can be strained by coughing or overwork.  The cartilage can be affected by inflammation and become sore (costochondritis). DIAGNOSIS  Lab tests or other studies may be needed to find the cause of your pain. Your health care provider may have you take a test called an ambulatory  electrocardiogram (ECG). An ECG records your heartbeat patterns over a 24-hour period. You may also have other tests, such as:  Transthoracic echocardiogram (TTE). During echocardiography, sound waves are used to evaluate how blood flows through your heart.  Transesophageal echocardiogram (TEE).  Cardiac monitoring. This allows your health care provider to monitor your heart rate and  rhythm in real time.  Holter monitor. This is a portable device that records your heartbeat and can help diagnose heart arrhythmias. It allows your health care provider to track your heart activity for several days, if needed.  Stress tests by exercise or by giving medicine that makes the heart beat faster. TREATMENT   Treatment depends on what may be causing your chest pain. Treatment may include:  Acid blockers for heartburn.  Anti-inflammatory medicine.  Pain medicine for inflammatory conditions.  Antibiotics if an infection is present.  You may be advised to change lifestyle habits. This includes stopping smoking and avoiding alcohol, caffeine, and chocolate.  You may be advised to keep your head raised (elevated) when sleeping. This reduces the chance of acid going backward from your stomach into your esophagus. Most of the time, nonspecific chest pain will improve within 2-3 days with rest and mild pain medicine.  HOME CARE INSTRUCTIONS   If antibiotics were prescribed, take them as directed. Finish them even if you start to feel better.  For the next few days, avoid physical activities that bring on chest pain. Continue physical activities as directed.  Do not use any tobacco products, including cigarettes, chewing tobacco, or electronic cigarettes.  Avoid drinking alcohol.  Only take medicine as directed by your health care provider.  Follow your health care provider's suggestions for further testing if your chest pain does not go away.  Keep any follow-up appointments you made. If you do not go to an appointment, you could develop lasting (chronic) problems with pain. If there is any problem keeping an appointment, call to reschedule. SEEK MEDICAL CARE IF:   Your chest pain does not go away, even after treatment.  You have a rash with blisters on your chest.  You have a fever. SEEK IMMEDIATE MEDICAL CARE IF:   You have increased chest pain or pain that spreads to  your arm, neck, jaw, back, or abdomen.  You have shortness of breath.  You have an increasing cough, or you cough up blood.  You have severe back or abdominal pain.  You feel nauseous or vomit.  You have severe weakness.  You faint.  You have chills. This is an emergency. Do not wait to see if the pain will go away. Get medical help at once. Call your local emergency services (911 in [place] Do not drive yourself to the hospital. MAKE SURE YOU:   Understand these instructions.  Will watch your condition.  Will get help right away if you are not doing well or get worse. Document Released: 09/20/2005 Document Revised: 12/16/2013 Document Reviewed: 07/16/2008 [facility] Patient Information 2015 [place], [place]. This information is not intended to replace advice given to you by your health care provider. Make sure you discuss any questions you have with your health care provider.

## 2015-06-05 NOTE — ED Notes (Signed)
Back from xray, no changes, alert, NAD, calm, interactive, no dyspnea noted.

## 2015-06-05 NOTE — ED Provider Notes (Signed)
CSN: [id number]     Arrival date & time 06/05/15  2132 History   This chart was scribed for Arby Barrette, MD by Abel Presto, ED Scribe. This patient was seen in room MH09/MH09 and the patient's care was started at 9:53 PM.    Chief Complaint  Patient presents with  . Jaw Pain     The history is provided by the patient. No language interpreter was used.   HPI Comments: Robin Hebert is a 54 y.o. female with h/o MI, CAD, 5 stents, HTN, and DM who presents to the Emergency Department complaining of left sided temporal headache with onset 2 days ago and left jaw pain with onset this morning. Pt reports occasionally the pain radiated down the side of her neck toward her chest. She notes recent pressure discomfort in her back and left arm pain for 2 days waxing and waning. She notes emotional stressors today and states pain began to radiate from the left side of her head down around her jaw after a "crying spell." She reports she's been very upset and had a lot of stressors recently. The pain have predominantly been staying on the left side of her head she indicates her parietal region and then today started radiating down her jaw and side of her face. Pt notes some abdominal pain and reports a hiatal hernia. She has taken Tylenol for relief. Pt denies numbness, bilateral LE swelling and fever. The patient states her main reason coming to the emergency department was because she has a history of heart problems and she was worried that this might have caused her to have a heart attack. She states her EKGs are always normal and her heart attacks. Identified by her lab results.  Past Medical History  Diagnosis Date  . Coronary artery disease   . Hypertension   . Thyroid disease   . Depression   . Acid reflux   . Anxiety   . Plantar fasciitis   . Sciatica   . Carpal tunnel syndrome   . History of anemia   . CAD (coronary artery disease)   . Lupus   . Diabetes mellitus without complication     Past Surgical History  Procedure Laterality Date  . Coronary angioplasty with stent placement  02/17/2009  . Tubal ligation     Family History  Problem Relation Age of Onset  . Alcoholism    . Cancer    . Diabetes    . Heart disease    . Hypertension    . Renal Disease    . Stroke    . Heart failure Mother   . Heart failure Father   . Congestive Heart Failure Sister   . Heart Problems Brother    History  Substance Use Topics  . Smoking status: Former Smoker    Quit date: 01/26/2009  . Smokeless tobacco: Not on file  . Alcohol Use: 0.6 oz/week    1 Glasses of wine per week   OB History    No data available     Review of Systems 10 Systems reviewed and all are negative for acute change except as noted in the HPI.     Allergies  Prednisone  Home Medications   Prior to Admission medications   Medication Sig Start Date End Date Taking? Authorizing Provider  METFORMIN HCL PO Take by mouth.   Yes Historical Provider, MD  ALPRAZolam Prudy Feeler) 0.5 MG tablet Take 0.5 mg by mouth 2 (two) times daily.  Historical Provider, MD  aspirin 81 MG tablet Take 81 mg by mouth daily.      Historical Provider, MD  atorvastatin (LIPITOR) 80 MG tablet Take 80 mg by mouth daily.      Historical Provider, MD  clopidogrel (PLAVIX) 75 MG tablet Take 75 mg by mouth daily.    Historical Provider, MD  escitalopram (LEXAPRO) 20 MG tablet Take 20 mg by mouth daily.      Historical Provider, MD  esomeprazole (NEXIUM) 40 MG capsule Take 40 mg by mouth daily at 12 noon.    Historical Provider, MD  ezetimibe (ZETIA) 10 MG tablet Take 10 mg by mouth daily.      Historical Provider, MD  lovastatin (MEVACOR) 10 MG tablet Take 10 mg by mouth at bedtime.    Historical Provider, MD  metoprolol tartrate (LOPRESSOR) 25 MG tablet Take 25 mg by mouth daily.     Historical Provider, MD  naproxen (NAPROSYN) 375 MG tablet Take 375 mg by mouth daily.    Historical Provider, MD  traMADol (ULTRAM) 50 MG tablet  Take 50 mg by mouth 2 (two) times daily.    Historical Provider, MD   BP 122/80 mmHg  Pulse 70  Temp(Src) 98.7 F (37.1 C) (Oral)  Resp 20  Ht  (1.473 m)  Wt 187 lb (84.823 kg)  BMI 39.09 kg/m2  SpO2 98%  LMP 02/18/2013 Physical Exam  Constitutional: She is oriented to person, place, and time. She appears well-developed and well-nourished.  HENT:  Head: Normocephalic and atraumatic.  Reproducible tenderness to palpation on the left temple and over the temporomandibular region. Patient does not have cervical pain palpation. The soft tissues of the neck are nontender. The carotid pulses are 2+ and symmetric.  Eyes: EOM are normal. Pupils are equal, round, and reactive to light.  Neck: Neck supple.  Cardiovascular: Normal rate, regular rhythm, normal heart sounds and intact distal pulses.   Pulmonary/Chest: Effort normal and breath sounds normal.  Abdominal: Soft. Bowel sounds are normal. She exhibits no distension. There is no tenderness.  Musculoskeletal: Normal range of motion. She exhibits no edema.  Neurological: She is alert and oriented to person, place, and time. She has normal strength. Coordination normal. GCS eye subscore is 4. GCS verbal subscore is 5. GCS motor subscore is 6.  Skin: Skin is warm, dry and intact.  Psychiatric: She has a normal mood and affect.    ED Course  Procedures (including critical care time) DIAGNOSTIC STUDIES: Oxygen Saturation is 98% on room air, normal by my interpretation.    COORDINATION OF CARE: 10:00 PM Discussed treatment plan with patient at beside, the patient agrees with the plan and has no further questions at this time.   Labs Review Labs Reviewed  CBC WITH DIFFERENTIAL/PLATELET - Abnormal; Notable for the following:    Hemoglobin 11.9 (*)    RDW 16.8 (*)    All other components within normal limits  COMPREHENSIVE METABOLIC PANEL - Abnormal; Notable for the following:    Glucose, Bld 138 (*)    Creatinine, Ser 1.06 (*)     Calcium 8.3 (*)    GFR calc non Af Amer 59 (*)    All other components within normal limits  TROPONIN I    Imaging Review Dg Chest 2 View  06/05/2015   CLINICAL DATA:  Acute onset of left-sided facial pain, headache and chest pain. Back pressure, dizziness, nausea and shortness of breath. Initial encounter.  EXAM: CHEST  2 VIEW  COMPARISON:  Chest radiograph performed 01/15/2015  FINDINGS: The lungs are well-aerated and clear. There is no evidence of focal opacification, pleural effusion or pneumothorax.  The heart is normal in size; the mediastinal contour is within normal limits. No acute osseous abnormalities are seen.  IMPRESSION: No acute cardiopulmonary process seen.   Electronically Signed   By: Roanna Raider M.D.   On: 06/05/2015 22:44     EKG Interpretation None     EKG: Sinus rhythm 74 PR 134 QRS 68 no ST-T wave changes no ischemic appearance. MDM   Final diagnoses:  Jaw pain  Psychosocial stressors  TMJ tenderness  Coronary artery disease involving native coronary artery of native heart without angina pectoris   The patient presents with left-sided facial pain that has a distinctly musculoskeletal component to it. She identifies significant emotional stressors and crying which exacerbated the pain causing it to radiate farther down her jaw on the side of her face. He does not give significantly cardiac ischemic symptoms however over the past 2 days she has episodically noted some pain in her back and her left arm. She does not show signs of an acute ischemic event. The EKG has no changes and the cardiac enzymes are negative. The patient has risk factors, I do believe she is safe to continue outpatient management and evaluation with her physicians.      Arby Barrette, MD 06/05/15 2320

## 2016-09-27 ENCOUNTER — Emergency Department (HOSPITAL_BASED_OUTPATIENT_CLINIC_OR_DEPARTMENT_OTHER): Payer: Medicare Other

## 2016-09-27 ENCOUNTER — Emergency Department (HOSPITAL_BASED_OUTPATIENT_CLINIC_OR_DEPARTMENT_OTHER)
Admission: EM | Admit: 2016-09-27 | Discharge: 2016-09-27 | Disposition: A | Discharge status: Home / Self Care | Payer: Medicare Other | Attending: Emergency Medicine | Admitting: Emergency Medicine

## 2016-09-27 ENCOUNTER — Encounter (HOSPITAL_BASED_OUTPATIENT_CLINIC_OR_DEPARTMENT_OTHER): Payer: Self-pay | Admitting: Respiratory Therapy

## 2016-09-27 DIAGNOSIS — Z7982 Long term (current) use of aspirin: Secondary | ICD-10-CM | POA: Insufficient documentation

## 2016-09-27 DIAGNOSIS — F17200 Nicotine dependence, unspecified, uncomplicated: Secondary | ICD-10-CM | POA: Insufficient documentation

## 2016-09-27 DIAGNOSIS — R079 Chest pain, unspecified: Secondary | ICD-10-CM | POA: Diagnosis present

## 2016-09-27 DIAGNOSIS — Z79899 Other long term (current) drug therapy: Secondary | ICD-10-CM | POA: Diagnosis not present

## 2016-09-27 DIAGNOSIS — I2510 Atherosclerotic heart disease of native coronary artery without angina pectoris: Secondary | ICD-10-CM | POA: Insufficient documentation

## 2016-09-27 DIAGNOSIS — J40 Bronchitis, not specified as acute or chronic: Secondary | ICD-10-CM | POA: Diagnosis not present

## 2016-09-27 DIAGNOSIS — I10 Essential (primary) hypertension: Secondary | ICD-10-CM | POA: Insufficient documentation

## 2016-09-27 DIAGNOSIS — Z7984 Long term (current) use of oral hypoglycemic drugs: Secondary | ICD-10-CM | POA: Insufficient documentation

## 2016-09-27 DIAGNOSIS — E119 Type 2 diabetes mellitus without complications: Secondary | ICD-10-CM | POA: Diagnosis not present

## 2016-09-27 HISTORY — DX: Acute myocardial infarction, unspecified: I21.9

## 2016-09-27 MED ORDER — BENZONATATE 100 MG PO CAPS: 100.0000 mg | ORAL_CAPSULE | Freq: Three times a day (TID) | ORAL | 0 refills | Status: AC

## 2016-09-27 MED ORDER — BENZONATATE 100 MG PO CAPS
100.0000 mg | ORAL_CAPSULE | Freq: Once | ORAL | Status: AC
  Administered 2016-09-27: 100 mg via ORAL
  Filled 2016-09-27: qty 1

## 2016-09-27 MED ORDER — ACETAMINOPHEN 500 MG PO TABS
1000.0000 mg | ORAL_TABLET | Freq: Once | ORAL | Status: AC
  Administered 2016-09-27: 1000 mg via ORAL
  Filled 2016-09-27: qty 2

## 2016-09-27 MED ORDER — IBUPROFEN 800 MG PO TABS
800.0000 mg | ORAL_TABLET | Freq: Once | ORAL | Status: AC
  Administered 2016-09-27: 800 mg via ORAL
  Filled 2016-09-27: qty 1

## 2016-09-27 NOTE — Discharge Instructions (Signed)
Take tylenol 2 pills 4 times a day and motrin 4 pills 3 times a day.  Drink plenty of fluids.  Return for worsening shortness of breath, headache, confusion. Follow up with your family doctor.   

## 2016-09-27 NOTE — ED Provider Notes (Signed)
[facility] Provider Note   CSN: [id number] Arrival date & time: 09/27/16  [id number]   By signing my name below, I, Robin Hebert, attest that this documentation has been prepared under the direction and in the presence of Robin Hebert Kieran Arreguin, DO  Electronically Signed: Clovis PuAvnee Hebert, ED Scribe. 09/27/16. 7:01 PM.   History   Chief Complaint Chief Complaint  Patient presents with  . Chest Pain     The history is provided by the patient. No language interpreter was used.   HPI Comments:  Robin Hebert is a 55 y.o. female who presents to the Emergency Department complaining of achy chest pain x 4 days. She notes she intermittently experiences sharp pain to her chest. She notes the pain radiates to her bilateral shoulder and under her armpit. She notes the pain is  exacerbated with coughing and to the touch. Pt notes she has an associated dry cough, SOB and has had sick contacts. She denies any recent injuries and fevers. No alleviating factors noted. Pt denies any other complaints at this time   Past Medical History:  Diagnosis Date  . Acid reflux   . Anxiety   . CAD (coronary artery disease)   . Carpal tunnel syndrome   . Coronary artery disease   . Depression   . Diabetes mellitus without complication (HCC)   . History of anemia   . Hypertension   . Lupus   . Myocardial infarct   . Plantar fasciitis   . Sciatica   . Thyroid disease     Patient Active Problem List   Diagnosis Date Noted  . Chest pain 04/25/2013  . Coronary artery disease   . Hypertension   . Thyroid disease   . Depression   . Acid reflux   . Anxiety     Past Surgical History:  Procedure Laterality Date  . CORONARY ANGIOPLASTY WITH STENT PLACEMENT  02/17/2009  . TUBAL LIGATION      OB History    No data available       Home Medications    Prior to Admission medications   Medication Sig Start Date End Date Taking? Authorizing Provider  GABAPENTIN PO Take by mouth.   Yes Historical  Provider, MD  METFORMIN HCL PO Take by mouth.   Yes Historical Provider, MD  ALPRAZolam Prudy Feeler(XANAX) 0.5 MG tablet Take 0.5 mg by mouth 2 (two) times daily.    Historical Provider, MD  aspirin 81 MG tablet Take 81 mg by mouth daily.      Historical Provider, MD  atorvastatin (LIPITOR) 80 MG tablet Take 80 mg by mouth daily.      Historical Provider, MD  benzonatate (TESSALON) 100 MG capsule Take 1 capsule (100 mg total) by mouth every 8 (eight) hours. 09/27/16   Robin Hebert Cleve Paolillo, DO  clopidogrel (PLAVIX) 75 MG tablet Take 75 mg by mouth daily.    Historical Provider, MD  escitalopram (LEXAPRO) 20 MG tablet Take 20 mg by mouth daily.      Historical Provider, MD  esomeprazole (NEXIUM) 40 MG capsule Take 40 mg by mouth daily at 12 noon.    Historical Provider, MD  metoprolol tartrate (LOPRESSOR) 25 MG tablet Take 25 mg by mouth daily.     Historical Provider, MD  naproxen (NAPROSYN) 375 MG tablet Take 375 mg by mouth daily.    Historical Provider, MD  traMADol (ULTRAM) 50 MG tablet Take 50 mg by mouth 2 (two) times daily.    Historical Provider, MD  Family History Family History  Problem Relation Age of Onset  . Alcoholism    . Cancer    . Diabetes    . Heart disease    . Hypertension    . Renal Disease    . Stroke    . Heart failure Mother   . Heart failure Father   . Congestive Heart Failure Sister   . Heart Problems Brother     Social History Social History  Substance Use Topics  . Smoking status: Current Every Day Smoker  . Smokeless tobacco: Never Used  . Alcohol use Yes     Comment: occ     Allergies   Prednisone   Review of Systems Review of Systems  Constitutional: Negative for chills and fever.  HENT: Negative for congestion and rhinorrhea.   Eyes: Negative for redness and visual disturbance.  Respiratory: Positive for shortness of breath. Negative for wheezing.   Cardiovascular: Positive for chest pain. Negative for palpitations.  Gastrointestinal: Negative for nausea  and vomiting.  Genitourinary: Negative for dysuria and urgency.  Musculoskeletal: Positive for myalgias. Negative for arthralgias.  Skin: Negative for pallor and wound.  Neurological: Negative for dizziness and headaches.     Physical Exam Updated Vital Signs BP 126/76 (BP Location: Right Arm)   Pulse 64   Temp 98.8 F (37.1 C) (Oral)   Resp 18   Ht 5\' 1"  (1.549 m)   Wt 192 lb (87.1 kg)   LMP 02/18/2013   SpO2 97%   BMI 36.28 kg/m   Physical Exam  Constitutional: She is oriented to person, place, and time. She appears well-developed and well-nourished. No distress.  HENT:  Head: Normocephalic and atraumatic.  Right Ear: Tympanic membrane is not erythematous and not bulging.  Left Ear: Tympanic membrane is not erythematous and not bulging.  Nose: No sinus tenderness.  Swollen turbinates. Post nasal drip. No noted sinus tenderness. Bilateral effusion to the TM with no bulging or erythema.   Eyes: EOM are normal. Pupils are equal, round, and reactive to light.  Neck: Normal range of motion. Neck supple.  Cardiovascular: Normal rate and regular rhythm.  Exam reveals no gallop and no friction rub.   No murmur heard. Pulmonary/Chest: Effort normal. She has no wheezes. She has no rales. She exhibits tenderness.  Abdominal: Soft. She exhibits no distension. There is no tenderness.  Little tenderness under diaphragm bilaterally.   Musculoskeletal: She exhibits no edema or tenderness.  Neurological: She is alert and oriented to person, place, and time.  Skin: Skin is warm and dry. She is not diaphoretic.  Psychiatric: She has a normal mood and affect. Her behavior is normal.  Nursing note and vitals reviewed.    ED Treatments / Results  DIAGNOSTIC STUDIES:  Oxygen Saturation is 98% on RA, normal by my interpretation.    COORDINATION OF CARE:  6:46 PM Discussed treatment plan with pt at bedside and pt agreed to plan.  Labs (all labs ordered are listed, but only abnormal  results are displayed) Labs Reviewed - No data to display  EKG  EKG Interpretation  Date/Time:  Wednesday September 27 2016 18:34:34 EDT Ventricular Rate:  63 PR Interval:  144 QRS Duration: 74 QT Interval:  428 QTC Calculation: 437 R Axis:   28 Text Interpretation:  Normal sinus rhythm Normal ECG No significant change since last tracing Confirmed by Ramaya Guile MD, Reuel Boom [phone]) on 09/27/2016 6:36:19 PM       Radiology Dg Chest 2 View  Result Date:  09/27/2016 CLINICAL DATA:  Chest pain x4 days, cough x3 days EXAM: CHEST  2 VIEW COMPARISON:  08/26/2016 FINDINGS: PA and lateral views chest demonstrate mildly low lung volumes. No focal infiltrate or effusion. Cardiomediastinal silhouette is stable. No pneumothorax. Degenerative changes of the spine. IMPRESSION: Mild low lung volumes. No radiographic evidence for acute cardiopulmonary abnormality. Electronically Signed   By: Jasmine Pang M.D.   On: 09/27/2016 19:16    Procedures Procedures (including critical care time)  Medications Ordered in ED Medications  benzonatate (TESSALON) capsule 100 mg (100 mg Oral Given 09/27/16 1855)  acetaminophen (TYLENOL) tablet 1,000 mg (1,000 mg Oral Given 09/27/16 1855)  ibuprofen (ADVIL,MOTRIN) tablet 800 mg (800 mg Oral Given 09/27/16 1855)     Initial Impression / Assessment and Plan / ED Course  I have reviewed the triage vital signs and the nursing notes.  Pertinent labs & imaging results that were available during my care of the patient were reviewed by me and considered in my medical decision making (see chart for details).  Clinical Course    55 yo F With a chief complaints of chest pain with cough. Going on for the past 4 days. Having some subjective fevers and chills. Chest x-ray is unremarkable. Patient likely has bronchitis. Treat with Tylenol and NSAIDs Tessalon Perles.  10:48 PM:  I have discussed the diagnosis/risks/treatment options with the patient and family and believe the pt to  be eligible for discharge home to follow-up with PCP. We also discussed returning to the ED immediately if new or worsening sx occur. We discussed the sx which are most concerning (e.g., sudden worsening pain, fever, inability to tolerate by mouth) that necessitate immediate return. Medications administered to the patient during their visit and any new prescriptions provided to the patient are listed below.  Medications given during this visit Medications  benzonatate (TESSALON) capsule 100 mg (100 mg Oral Given 09/27/16 1855)  acetaminophen (TYLENOL) tablet 1,000 mg (1,000 mg Oral Given 09/27/16 1855)  ibuprofen (ADVIL,MOTRIN) tablet 800 mg (800 mg Oral Given 09/27/16 1855)     The patient appears reasonably screen and/or stabilized for discharge and I doubt any other medical condition or other [facility] requiring further screening, evaluation, or treatment in the ED at this time prior to discharge.    Final Clinical Impressions(s) / ED Diagnoses   Final diagnoses:  Bronchitis    New Prescriptions Discharge Medication List as of 09/27/2016  7:22 PM    START taking these medications   Details  benzonatate (TESSALON) 100 MG capsule Take 1 capsule (100 mg total) by mouth every 8 (eight) hours., Starting Wed 09/27/2016, Print       I personally performed the services described in this documentation, which was scribed in my presence. The recorded information has been reviewed and is accurate.     Robin Plan, DO 09/27/16 2248

## 2016-09-27 NOTE — ED Triage Notes (Signed)
C/o CP x 3-4 days-also c/o cough x 2-3 days-NAD-steady gait

## 2016-09-27 NOTE — ED Notes (Signed)
Patient sitting in room fully clothed. Patient states that she is having 9/10 chest pain. The patient reports that she is "fine sitting in this chair". Patient dressed on her own. Gown on bed.

## 2021-12-21 ENCOUNTER — Emergency Department (HOSPITAL_BASED_OUTPATIENT_CLINIC_OR_DEPARTMENT_OTHER): Payer: Medicare Other

## 2021-12-21 ENCOUNTER — Other Ambulatory Visit: Payer: Self-pay

## 2021-12-21 ENCOUNTER — Emergency Department (HOSPITAL_BASED_OUTPATIENT_CLINIC_OR_DEPARTMENT_OTHER)
Admission: EM | Admit: 2021-12-21 | Discharge: 2021-12-21 | Disposition: A | Discharge status: Home / Self Care | Payer: Medicare Other | Attending: Emergency Medicine | Admitting: Emergency Medicine

## 2021-12-21 ENCOUNTER — Encounter (HOSPITAL_BASED_OUTPATIENT_CLINIC_OR_DEPARTMENT_OTHER): Payer: Self-pay | Admitting: *Deleted

## 2021-12-21 DIAGNOSIS — I10 Essential (primary) hypertension: Secondary | ICD-10-CM | POA: Diagnosis not present

## 2021-12-21 DIAGNOSIS — E119 Type 2 diabetes mellitus without complications: Secondary | ICD-10-CM | POA: Diagnosis not present

## 2021-12-21 DIAGNOSIS — M549 Dorsalgia, unspecified: Secondary | ICD-10-CM | POA: Diagnosis not present

## 2021-12-21 DIAGNOSIS — I2510 Atherosclerotic heart disease of native coronary artery without angina pectoris: Secondary | ICD-10-CM | POA: Diagnosis not present

## 2021-12-21 DIAGNOSIS — Z7902 Long term (current) use of antithrombotics/antiplatelets: Secondary | ICD-10-CM | POA: Diagnosis not present

## 2021-12-21 DIAGNOSIS — M25512 Pain in left shoulder: Secondary | ICD-10-CM | POA: Diagnosis not present

## 2021-12-21 DIAGNOSIS — S0990XA Unspecified injury of head, initial encounter: Secondary | ICD-10-CM | POA: Insufficient documentation

## 2021-12-21 DIAGNOSIS — W07XXXA Fall from chair, initial encounter: Secondary | ICD-10-CM | POA: Diagnosis not present

## 2021-12-21 DIAGNOSIS — Z955 Presence of coronary angioplasty implant and graft: Secondary | ICD-10-CM | POA: Diagnosis not present

## 2021-12-21 DIAGNOSIS — W19XXXA Unspecified fall, initial encounter: Secondary | ICD-10-CM

## 2021-12-21 DIAGNOSIS — F17200 Nicotine dependence, unspecified, uncomplicated: Secondary | ICD-10-CM | POA: Insufficient documentation

## 2021-12-21 DIAGNOSIS — Z79899 Other long term (current) drug therapy: Secondary | ICD-10-CM | POA: Insufficient documentation

## 2021-12-21 DIAGNOSIS — Z7982 Long term (current) use of aspirin: Secondary | ICD-10-CM | POA: Diagnosis not present

## 2021-12-21 MED ORDER — LIDOCAINE 5 % EX PTCH
1.0000 | MEDICATED_PATCH | Freq: Once | CUTANEOUS | Status: DC
  Administered 2021-12-21: 17:00:00 1 via TRANSDERMAL
  Filled 2021-12-21: qty 1

## 2021-12-21 MED ORDER — METHOCARBAMOL 500 MG PO TABS: 500.0000 mg | ORAL_TABLET | Freq: Two times a day (BID) | ORAL | 0 refills | Status: AC

## 2021-12-21 MED ORDER — ACETAMINOPHEN 500 MG PO TABS: 1000.0000 mg | ORAL_TABLET | Freq: Once | ORAL | Status: DC

## 2021-12-21 NOTE — ED Notes (Signed)
Chair she was sitting in fell,  pt states hit back of head no loc,  c/o head, neck ,shoulder, back pain  occurred 3 days ago

## 2021-12-21 NOTE — ED Triage Notes (Signed)
C/o head injury x 3 days ago , c/o h/a , and upper back pain from fall out of chair

## 2021-12-21 NOTE — ED Provider Notes (Signed)
[facility] EMERGENCY DEPARTMENT Provider Note   CSN: [id number] Arrival date & time: 12/21/21  1333     History Chief Complaint  Patient presents with   Head Injury    Robin Hebert is a 60 y.o. female.  Robin Hebert is a 60 y.o. female with a history of hypertension, diabetes, CAD, reflux, anxiety, Lupus, who presents to the emergency department for evaluation after a fall.  Patient reports 3 days ago she was sitting in a chair when the screw came out of it causing him to break into her to fall backwards.  She reports striking the back of her head.  She did not completely lose consciousness but felt dazed for a few minutes afterwards.  Since then she has had persistent posterior headache as well as persistent and worsening pain in her neck, left shoulder and back.  She has tried Tylenol as well as some tramadol without much relief in pain.  She denies associated numbness or weakness but does report some tingling intermittently in her left arm.  No visual changes, no nausea or vomiting.  She has felt woozy intermittently.  No other aggravating or alleviating factors.  Patient is on aspirin and Plavix but no other anticoagulation.  The history is provided by the patient and medical records.      Past Medical History:  Diagnosis Date   Acid reflux    Anxiety    CAD (coronary artery disease)    Carpal tunnel syndrome    Coronary artery disease    Depression    Diabetes mellitus without complication (HCC)    History of anemia    Hypertension    Lupus ([place])    Myocardial infarct [facility])    Plantar fasciitis    Sciatica    Thyroid disease     Patient Active Problem List   Diagnosis Date Noted   Chest pain 04/25/2013   Coronary artery disease    Hypertension    Thyroid disease    Depression    Acid reflux    Anxiety     Past Surgical History:  Procedure Laterality Date   CORONARY ANGIOPLASTY WITH STENT PLACEMENT  02/17/2009   TUBAL LIGATION       OB  History   No obstetric history on file.     Family History  Problem Relation Age of Onset   Alcoholism Unknown    Cancer Unknown    Diabetes Unknown    Heart disease Unknown    Hypertension Unknown    Renal Disease Unknown    Stroke Unknown    Heart failure Mother    Heart failure Father    Congestive Heart Failure Sister    Heart Problems Brother     Social History   Tobacco Use   Smoking status: Every Day   Smokeless tobacco: Never  Substance Use Topics   Alcohol use: Yes    Comment: occ   Drug use: Yes    Types: Marijuana    Home Medications Prior to Admission medications   Medication Sig Start Date End Date Taking? Authorizing Provider  methocarbamol (ROBAXIN) 500 MG tablet Take 1 tablet (500 mg total) by mouth 2 (two) times daily. 12/21/21  Yes Jacqlyn Larsen, PA-C  ALPRAZolam Duanne Moron) 0.5 MG tablet Take 0.5 mg by mouth 2 (two) times daily.    [provider]  aspirin 81 MG tablet Take 81 mg by mouth daily.      [provider]  atorvastatin (LIPITOR)  80 MG tablet Take 80 mg by mouth daily.      [provider]  benzonatate (TESSALON) 100 MG capsule Take 1 capsule (100 mg total) by mouth every 8 (eight) hours. 09/27/16   Deno Etienne, DO  clopidogrel (PLAVIX) 75 MG tablet Take 75 mg by mouth daily.    [provider]  escitalopram (LEXAPRO) 20 MG tablet Take 20 mg by mouth daily.      [provider]  esomeprazole (NEXIUM) 40 MG capsule Take 40 mg by mouth daily at 12 noon.    [provider]  GABAPENTIN PO Take by mouth.    [provider]  METFORMIN HCL PO Take by mouth.    [provider]  metoprolol tartrate (LOPRESSOR) 25 MG tablet Take 25 mg by mouth daily.     [provider]  naproxen (NAPROSYN) 375 MG tablet Take 375 mg by mouth daily.    [provider]  traMADol (ULTRAM) 50 MG tablet Take 50 mg by mouth 2 (two) times daily.    [provider]    Allergies     Prednisone  Review of Systems   Review of Systems  Constitutional:  Negative for chills and fever.  HENT: Negative.    Eyes:  Negative for visual disturbance.  Respiratory:  Negative for cough and shortness of breath.   Cardiovascular:  Negative for chest pain.  Gastrointestinal:  Negative for abdominal pain.  Musculoskeletal:  Positive for arthralgias, back pain and neck pain.  Skin:  Negative for color change and wound.  Neurological:  Positive for headaches. Negative for dizziness, syncope, facial asymmetry, weakness, light-headedness and numbness.  All other systems reviewed and are negative.  Physical Exam Updated Vital Signs BP (!) 141/82 (BP Location: Right Arm)    Pulse (!) 54    Temp 98.3 F (36.8 C) (Oral)    Resp 18    Ht 4\' 10"  (1.473 m)    Wt 73.5 kg    LMP 02/18/2013    SpO2 99%    BMI 33.86 kg/m   Physical Exam Vitals and nursing note reviewed.  Constitutional:      General: She is not in acute distress.    Appearance: Normal appearance. She is well-developed. She is not ill-appearing or diaphoretic.  HENT:     Head: Normocephalic.     Comments: Mild tenderness over the posterior scalp without palpable step-off, hematoma or deformity, negative Seidel sign    Nose: Nose normal.     Mouth/Throat:     Mouth: Mucous membranes are moist.     Pharynx: Oropharynx is clear.  Eyes:     General:        Right eye: No discharge.        Left eye: No discharge.  Neck:     Comments: Tenderness over the midline and left paraspinal muscles without palpable step-off or deformity.  Palpable spasm over the left trapezius muscle Cardiovascular:     Rate and Rhythm: Normal rate and regular rhythm.     Heart sounds: Normal heart sounds.  Pulmonary:     Effort: Pulmonary effort is normal. No respiratory distress.     Breath sounds: Normal breath sounds.  Chest:     Chest wall: No tenderness.  Abdominal:     General: Bowel sounds are normal. There is no distension.      Palpations: Abdomen is soft. There is no mass.     Tenderness: There is no abdominal tenderness. There is  no guarding.  Musculoskeletal:        General: Tenderness present.     Cervical back: Neck supple. Tenderness present.     Comments: Mild diffuse tenderness throughout the thoracic and lumbar spine without focal step-off or deformity. There is mild tenderness over the left shoulder primarily over the superior aspect without palpable deformity, able to range shoulder with some discomfort.  Distal pulses 2+ All other joints supple and easily movable, all compartments soft  Skin:    General: Skin is warm and dry.  Neurological:     Mental Status: She is alert and oriented to person, place, and time.     Coordination: Coordination normal.  Psychiatric:        Mood and Affect: Mood normal.        Behavior: Behavior normal.    ED Results / Procedures / Treatments   Labs (all labs ordered are listed, but only abnormal results are displayed) Labs Reviewed - No data to display  EKG None  Radiology DG Thoracic Spine 2 View  Result Date: 12/21/2021 CLINICAL DATA:  [place] from a chair. EXAM: THORACIC SPINE 2 VIEWS COMPARISON:  None. FINDINGS: Normal alignment of the thoracic vertebral bodies. Disc spaces and vertebral bodies are maintained. Moderate degenerative changes. No acute compression fracture. No abnormal paraspinal soft tissue thickening. The visualized posterior ribs are intact. IMPRESSION: Moderate degenerative changes but no acute bony findings. Electronically Signed   By: Marijo Sanes M.D.   On: 12/21/2021 15:50   DG Lumbar Spine Complete  Result Date: 12/21/2021 CLINICAL DATA:  [place]. EXAM: LUMBAR SPINE - COMPLETE 4+ VIEW COMPARISON:  None. FINDINGS: Normal alignment of the lumbar vertebral bodies. Disc spaces and vertebral bodies are maintained. The facets are normally aligned. No pars defects. The visualized bony pelvis is intact. IMPRESSION: Normal alignment and no acute  bony findings. Electronically Signed   By: Marijo Sanes M.D.   On: 12/21/2021 15:52   CT Head Wo Contrast  Result Date: 12/21/2021 CLINICAL DATA:  Head trauma moderate to severe, midline cervical spine tenderness EXAM: CT HEAD WITHOUT CONTRAST CT CERVICAL SPINE WITHOUT CONTRAST TECHNIQUE: Multidetector CT imaging of the head and cervical spine was performed following the standard protocol without intravenous contrast. Multiplanar CT image reconstructions of the cervical spine were also generated. COMPARISON:  May 20, 2015 FINDINGS: CT HEAD FINDINGS Brain: No evidence of acute infarction, hemorrhage, hydrocephalus, extra-axial collection or mass lesion/mass effect. Vascular: No hyperdense vessel. Atherosclerotic calcifications of the internal carotid arteries at skull base. Skull: Normal. Negative for fracture or focal lesion. Sinuses/Orbits: Visualized portions of the paranasal sinuses mastoid air cells are clear. Other: None. CT CERVICAL SPINE FINDINGS Alignment: Straightening of the normal cervical lordosis no evidence of traumatic listhesis. Skull base and vertebrae: No acute fracture. No primary bone lesion or focal pathologic process. Soft tissues and spinal canal: No prevertebral fluid or swelling. No visible canal hematoma. 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 Disc levels: Lower cervical predominant multilevel degenerative changes spine with disc space narrowing, osteophytosis and uncovertebral facet hypertrophy which produce multilevel neural foraminal and spinal canal impingement. Upper chest: Negative. Other: None IMPRESSION: 1. No evidence of acute intracranial process. 2. No evidence of acute fracture or traumatic listhesis of the cervical spine. 3. Lower cervical predominant degenerative changes of the cervical spine with multilevel neural foraminal and spinal canal impingement. 4. Ossification of the posterior longitudinal ligament  with associated spinal canal narrowing at C3-C4. Electronically Signed  By: Dahlia Bailiff M.D.   On: 12/21/2021 15:59   CT Cervical Spine Wo Contrast  Result Date: 12/21/2021 CLINICAL DATA:  Head trauma moderate to severe, midline cervical spine tenderness EXAM: CT HEAD WITHOUT CONTRAST CT CERVICAL SPINE WITHOUT CONTRAST TECHNIQUE: Multidetector CT imaging of the head and cervical spine was performed following the standard protocol without intravenous contrast. Multiplanar CT image reconstructions of the cervical spine were also generated. COMPARISON:  May 20, 2015 FINDINGS: CT HEAD FINDINGS Brain: No evidence of acute infarction, hemorrhage, hydrocephalus, extra-axial collection or mass lesion/mass effect. Vascular: No hyperdense vessel. Atherosclerotic calcifications of the internal carotid arteries at skull base. Skull: Normal. Negative for fracture or focal lesion. Sinuses/Orbits: Visualized portions of the paranasal sinuses mastoid air cells are clear. Other: None. CT CERVICAL SPINE FINDINGS Alignment: Straightening of the normal cervical lordosis no evidence of traumatic listhesis. Skull base and vertebrae: No acute fracture. No primary bone lesion or focal pathologic process. Soft tissues and spinal canal: No prevertebral fluid or swelling. No visible canal hematoma. 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 Disc levels: Lower cervical predominant multilevel degenerative changes spine with disc space narrowing, osteophytosis and uncovertebral facet hypertrophy which produce multilevel neural foraminal and spinal canal impingement. Upper chest: Negative. Other: None IMPRESSION: 1. No evidence of acute intracranial process. 2. No evidence of acute fracture or traumatic listhesis of the cervical spine. 3. Lower cervical predominant degenerative changes of the cervical spine with multilevel neural foraminal and spinal canal impingement. 4.  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 Electronically Signed   By: Dahlia Bailiff M.D.   On: 12/21/2021 15:59   DG Shoulder Left  Result Date: 12/21/2021 CLINICAL DATA:  [place].  Left shoulder pain. EXAM: LEFT SHOULDER - 2+ VIEW COMPARISON:  None. FINDINGS: The glenohumeral and AC joints are intact. No acute fracture. The left upper ribs are intact. IMPRESSION: No acute bony findings. Electronically Signed   By: Marijo Sanes M.D.   On: 12/21/2021 15:51    Procedures Procedures   Medications Ordered in ED Medications - No data to display  ED Course  I have reviewed the triage vital signs and the nursing notes.  Pertinent labs & imaging results that were available during my care of the patient were reviewed by me and considered in my medical decision making (see chart for details).    MDM Rules/Calculators/A&P                          60 year old female presents to the emergency department for evaluation of continued headache and pain in her neck, back and left shoulder after a fall 3 days ago.  No initial LOC.  No focal neurologic deficits on exam.  We will get CT of the head and cervical spine as well as plain films of the left shoulder, thoracic and lumbar spine.  Pain treated here in the ED.  I have personally reviewed imaging and agree with radiologist findings.  CTs of the head and cervical spine with no acute intracranial abnormality or acute fracture or traumatic listhesis of the cervical spine.  Patient with multilevel degenerative changes with neural foraminal and spinal canal stenosis but patient without any focal neurologic deficits on exam  X-rays of the left shoulder, thoracic and lumbar spine without any acute bony abnormality.  Discussed reassuring imaging with patient.  She has palpable muscle spasm on exam, will prescribe  Robaxin and encouraged continued supportive treatment and PCP follow-up if symptoms not  improving.  Discharge instructions provided as well as return precautions.  Discharged home in good condition.   Final Clinical Impression(s) / ED Diagnoses Final diagnoses:  Fall, initial encounter  Injury of head, initial encounter  Acute pain of left shoulder  Back pain due to injury    Rx / DC Orders ED Discharge Orders          Ordered    methocarbamol (ROBAXIN) 500 MG tablet  2 times daily        12/21/21 1703             Dartha Lodge, Cordelia Poche 12/22/21 0025    Rolan Bucco, MD 12/22/21 [phone]

## 2021-12-21 NOTE — Discharge Instructions (Addendum)
Your imaging was reassuring today.  Continue to treat your symptoms supportively with Tylenol 1000 mg every 6 hours, you can use prescribed Robaxin, a muscle relaxer, as needed for muscle spasm.  This medication can cause drowsiness, use with caution and do not take before driving. You can also use over the counter lidocaine patches and ice and heat to help with pain. Follow up with your PCP if pain not improving.

## 2021-12-21 NOTE — ED Notes (Signed)
Patient transported to radiology
